# Patient Record
Sex: Male | Born: 1990 | Race: White | Hispanic: No | Marital: Single | Smoking: Former smoker
Health system: Southern US, Community
[De-identification: ages and names within clinical notes are randomized; demographics above are authoritative.]

## PROBLEM LIST (undated history)

## (undated) DIAGNOSIS — F909 Attention-deficit hyperactivity disorder, unspecified type: Secondary | ICD-10-CM

## (undated) DIAGNOSIS — F319 Bipolar disorder, unspecified: Secondary | ICD-10-CM

## (undated) DIAGNOSIS — J45909 Unspecified asthma, uncomplicated: Secondary | ICD-10-CM

## (undated) DIAGNOSIS — F988 Other specified behavioral and emotional disorders with onset usually occurring in childhood and adolescence: Secondary | ICD-10-CM

## (undated) DIAGNOSIS — F449 Dissociative and conversion disorder, unspecified: Secondary | ICD-10-CM

## (undated) HISTORY — PX: MOLE REMOVAL: SHX2046

---
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
## 2013-12-13 ENCOUNTER — Emergency Department (HOSPITAL_COMMUNITY)
Admission: EM | Admit: 2013-12-13 | Discharge: 2013-12-13 | Disposition: A | Discharge status: Home / Self Care | Payer: Self-pay | Attending: Emergency Medicine | Admitting: Emergency Medicine

## 2013-12-13 ENCOUNTER — Encounter (HOSPITAL_COMMUNITY): Payer: Self-pay | Admitting: Emergency Medicine

## 2013-12-13 DIAGNOSIS — M25539 Pain in unspecified wrist: Secondary | ICD-10-CM | POA: Insufficient documentation

## 2013-12-13 DIAGNOSIS — M79639 Pain in unspecified forearm: Secondary | ICD-10-CM

## 2013-12-13 DIAGNOSIS — F17200 Nicotine dependence, unspecified, uncomplicated: Secondary | ICD-10-CM | POA: Insufficient documentation

## 2013-12-13 DIAGNOSIS — J45909 Unspecified asthma, uncomplicated: Secondary | ICD-10-CM | POA: Insufficient documentation

## 2013-12-13 DIAGNOSIS — Z8659 Personal history of other mental and behavioral disorders: Secondary | ICD-10-CM | POA: Insufficient documentation

## 2013-12-13 HISTORY — DX: Attention-deficit hyperactivity disorder, unspecified type: F90.9

## 2013-12-13 HISTORY — DX: Unspecified asthma, uncomplicated: J45.909

## 2013-12-13 HISTORY — DX: Dissociative and conversion disorder, unspecified: F44.9

## 2013-12-13 HISTORY — DX: Bipolar disorder, unspecified: F31.9

## 2013-12-13 HISTORY — DX: Other specified behavioral and emotional disorders with onset usually occurring in childhood and adolescence: F98.8

## 2013-12-13 MED ORDER — METAXALONE 800 MG PO TABS
800.0000 mg | ORAL_TABLET | Freq: Three times a day (TID) | ORAL | Status: DC
Start: 2013-12-13 — End: 2014-08-19

## 2013-12-13 MED ORDER — IBUPROFEN 600 MG PO TABS: 600.0000 mg | ORAL_TABLET | Freq: Four times a day (QID) | ORAL | Status: DC | PRN

## 2013-12-13 NOTE — ED Notes (Addendum)
Pt c/o pain from forearm to elbow and down to wrist x 3-4 days, pt denies injury. Pt states he is having difficulty closing fist. States his arms feels tired. Strong pulse, brisk cap refill

## 2013-12-13 NOTE — ED Provider Notes (Signed)
CSN: [id number]     Arrival date & time 12/13/13  [id number] History   First MD Initiated Contact with Patient 12/13/13 0715     Chief Complaint  Patient presents with  . Arm Pain     (Consider location/radiation/quality/duration/timing/severity/associated sxs/prior Treatment) Patient is a 23 y.o. male presenting with arm pain. The history is provided by the patient.  Arm Pain   patient here complaining of pain to his left forearm x3-4 days. Pain characterized as sharp worse with movement of his wrist. Denies any history of trauma. Does work in [employer] and does heavy lifting. Pain starts at his elbow and goes onto his wrist. No fever or chills. No erythema noted. No prior history of same. Symptoms persisted and no treatment used prior to arrival  Past Medical History  Diagnosis Date  . Asthma   . ADD (attention deficit disorder)   . ADHD (attention deficit hyperactivity disorder)   . Disassociation disorder   . Manic depression    Past Surgical History  Procedure Laterality Date  . Mole removal     No family history on file. History  Substance Use Topics  . Smoking status: Current Every Day Smoker  . Smokeless tobacco: Not on file  . Alcohol Use: Yes    Review of Systems  All other systems reviewed and are negative.     Allergies  Review of patient's allergies indicates no known allergies.  Home Medications   Prior to Admission medications   Medication Sig Start Date End Date Taking? Authorizing Provider  ibuprofen (ADVIL,MOTRIN) 600 MG tablet Take 1 tablet (600 mg total) by mouth every 6 (six) hours as needed. 12/13/13   Toy BakerAnthony T Mataio Mele, MD  metaxalone (SKELAXIN) 800 MG tablet Take 1 tablet (800 mg total) by mouth 3 (three) times daily. 12/13/13   Toy BakerAnthony T Velia Pamer, MD   BP 131/85  Pulse 83  Temp(Src) 98.3 F (36.8 C) (Oral)  Resp 16  Ht 5\' 7"  (1.702 m)  Wt 150 lb (68.04 kg)  BMI 23.49 kg/m2  SpO2 99% Physical Exam  Nursing note and vitals  reviewed. Constitutional: He is oriented to person, place, and time. He appears well-developed and well-nourished.  Non-toxic appearance. No distress.  HENT:  Head: Normocephalic and atraumatic.  Eyes: Conjunctivae, EOM and lids are normal. Pupils are equal, round, and reactive to light.  Neck: Normal range of motion. Neck supple. No tracheal deviation present. No mass present.  Cardiovascular: Normal rate, regular rhythm and normal heart sounds.  Exam reveals no gallop.   No murmur heard. Pulmonary/Chest: Effort normal and breath sounds normal. No stridor. No respiratory distress. He has no decreased breath sounds. He has no wheezes. He has no rhonchi. He has no rales.  Abdominal: Soft. Normal appearance and bowel sounds are normal. He exhibits no distension. There is no tenderness. There is no rebound and no CVA tenderness.  Musculoskeletal: Normal range of motion. He exhibits no edema and no tenderness.       Arms: Neurological: He is alert and oriented to person, place, and time. He has normal strength. No cranial nerve deficit or sensory deficit. GCS eye subscore is 4. GCS verbal subscore is 5. GCS motor subscore is 6.  Skin: Skin is warm and dry. No abrasion and no rash noted.  Psychiatric: He has a normal mood and affect. His speech is normal and behavior is normal.    ED Course  Procedures (including critical care time) Labs Review Labs Reviewed - No data  to display  Imaging Review No results found.   EKG Interpretation None      MDM   Final diagnoses:  Forearm pain   Patient without evidence of infection at this time. Likely has tendinitis. Return precautions given     Toy BakerAnthony T Leathie Weich, MD 12/13/13 [phone]

## 2013-12-13 NOTE — Discharge Instructions (Signed)
Return here for swelling in your forearm, trouble using the fingers in the left hand, severe pain, or any other problems

## 2013-12-21 ENCOUNTER — Emergency Department (HOSPITAL_COMMUNITY)
Admission: EM | Admit: 2013-12-21 | Discharge: 2013-12-21 | Disposition: A | Discharge status: Home / Self Care | Payer: Self-pay | Attending: Emergency Medicine | Admitting: Emergency Medicine

## 2013-12-21 ENCOUNTER — Encounter (HOSPITAL_COMMUNITY): Payer: Self-pay | Admitting: Emergency Medicine

## 2013-12-21 DIAGNOSIS — F1010 Alcohol abuse, uncomplicated: Secondary | ICD-10-CM

## 2013-12-21 DIAGNOSIS — F1994 Other psychoactive substance use, unspecified with psychoactive substance-induced mood disorder: Secondary | ICD-10-CM

## 2013-12-21 DIAGNOSIS — F17200 Nicotine dependence, unspecified, uncomplicated: Secondary | ICD-10-CM | POA: Insufficient documentation

## 2013-12-21 DIAGNOSIS — R45851 Suicidal ideations: Secondary | ICD-10-CM

## 2013-12-21 DIAGNOSIS — F1094 Alcohol use, unspecified with alcohol-induced mood disorder: Secondary | ICD-10-CM | POA: Diagnosis present

## 2013-12-21 DIAGNOSIS — IMO0002 Reserved for concepts with insufficient information to code with codable children: Secondary | ICD-10-CM | POA: Insufficient documentation

## 2013-12-21 DIAGNOSIS — F29 Unspecified psychosis not due to a substance or known physiological condition: Secondary | ICD-10-CM | POA: Insufficient documentation

## 2013-12-21 DIAGNOSIS — F1210 Cannabis abuse, uncomplicated: Secondary | ICD-10-CM

## 2013-12-21 DIAGNOSIS — F10920 Alcohol use, unspecified with intoxication, uncomplicated: Secondary | ICD-10-CM

## 2013-12-21 DIAGNOSIS — J45909 Unspecified asthma, uncomplicated: Secondary | ICD-10-CM | POA: Insufficient documentation

## 2013-12-21 DIAGNOSIS — Z79899 Other long term (current) drug therapy: Secondary | ICD-10-CM | POA: Insufficient documentation

## 2013-12-21 LAB — CBC WITH DIFFERENTIAL/PLATELET
Basophils Absolute: 0 10*3/uL (ref 0.0–0.1)
Basophils Relative: 0 % (ref 0–1)
Eosinophils Absolute: 0.1 10*3/uL (ref 0.0–0.7)
Eosinophils Relative: 2 % (ref 0–5)
HCT: 40.7 % (ref 39.0–52.0)
Hemoglobin: 14.6 g/dL (ref 13.0–17.0)
Lymphocytes Relative: 43 % (ref 12–46)
Lymphs Abs: 3 10*3/uL (ref 0.7–4.0)
MCH: 31.4 pg (ref 26.0–34.0)
MCHC: 35.9 g/dL (ref 30.0–36.0)
MCV: 87.5 fL (ref 78.0–100.0)
Monocytes Absolute: 0.4 10*3/uL (ref 0.1–1.0)
Monocytes Relative: 5 % (ref 3–12)
Neutro Abs: 3.3 10*3/uL (ref 1.7–7.7)
Neutrophils Relative %: 50 % (ref 43–77)
Platelets: 216 10*3/uL (ref 150–400)
RBC: 4.65 MIL/uL (ref 4.22–5.81)
RDW: 13.2 % (ref 11.5–15.5)
WBC: 6.8 10*3/uL (ref 4.0–10.5)

## 2013-12-21 LAB — BASIC METABOLIC PANEL
BUN: 13 mg/dL (ref 6–23)
CO2: 25 mEq/L (ref 19–32)
Calcium: 9.4 mg/dL (ref 8.4–10.5)
Chloride: 103 mEq/L (ref 96–112)
Creatinine, Ser: 1.03 mg/dL (ref 0.50–1.35)
GFR calc Af Amer: >90 mL/min (ref >90)
GFR calc non Af Amer: >90 mL/min (ref >90)
Glucose, Bld: 99 mg/dL (ref 70–99)
Potassium: 3.7 mEq/L (ref 3.7–5.3)
Sodium: 143 mEq/L (ref 137–147)

## 2013-12-21 LAB — RAPID URINE DRUG SCREEN, HOSP PERFORMED
Amphetamines: NOT DETECTED
Barbiturates: NOT DETECTED
Benzodiazepines: NOT DETECTED
Cocaine: NOT DETECTED
Opiates: NOT DETECTED
Tetrahydrocannabinol: POSITIVE — AB

## 2013-12-21 LAB — ETHANOL: Alcohol, Ethyl (B): 137 mg/dL — ABNORMAL HIGH (ref 0–11)

## 2013-12-21 MED ORDER — ZIPRASIDONE MESYLATE 20 MG IM SOLR
10.0000 mg | Freq: Once | INTRAMUSCULAR | Status: AC
  Administered 2013-12-21: 10 mg via INTRAMUSCULAR
  Filled 2013-12-21: qty 20

## 2013-12-21 MED ORDER — ACETAMINOPHEN 325 MG PO TABS: 650.0000 mg | ORAL_TABLET | ORAL | Status: DC | PRN

## 2013-12-21 MED ORDER — ONDANSETRON HCL 4 MG PO TABS: 4.0000 mg | ORAL_TABLET | Freq: Three times a day (TID) | ORAL | Status: DC | PRN

## 2013-12-21 MED ORDER — IBUPROFEN 200 MG PO TABS: 600.0000 mg | ORAL_TABLET | Freq: Three times a day (TID) | ORAL | Status: DC | PRN

## 2013-12-21 MED ORDER — STERILE WATER FOR INJECTION IJ SOLN
INTRAMUSCULAR | Status: AC
  Administered 2013-12-21: 1.2 mL
  Filled 2013-12-21: qty 10

## 2013-12-21 MED ORDER — ZOLPIDEM TARTRATE 5 MG PO TABS: 5.0000 mg | ORAL_TABLET | Freq: Every evening | ORAL | Status: DC | PRN

## 2013-12-21 MED ORDER — NICOTINE 21 MG/24HR TD PT24: 21.0000 mg | MEDICATED_PATCH | Freq: Every day | TRANSDERMAL | Status: DC

## 2013-12-21 MED ORDER — ALUM & MAG HYDROXIDE-SIMETH 200-200-20 MG/5ML PO SUSP: 30.0000 mL | ORAL | Status: DC | PRN

## 2013-12-21 NOTE — ED Notes (Signed)
Pt arrived to the ED with a complaint of suicidal intent.  Pt's girlfriend called [employer] stating that he was manic and wanted to hang himself from a bridge.  Pt states he has been suicidal for 6 years.  Pt states he will behave until he gets back to the psych ED then he will strip the sheets and hang himself

## 2013-12-21 NOTE — ED Notes (Signed)
Attempted to review D/C instructions but he stated "Ive been in and out of the system so much I could probably read them to you." No complaints voiced. States pain is a 1 due to "a scab on my foot." belongings bag x1 returned. Girlfriend here to transport home. Escorted by mental health tech to front of the hospital.

## 2013-12-21 NOTE — Discharge Instructions (Signed)
Alcohol Intoxication Alcohol intoxication occurs when you drink enough alcohol that it affects your ability to function. It can be mild or very severe. Drinking a lot of alcohol in a short time is called binge drinking. This can be very harmful. Drinking alcohol can also be more dangerous if you are taking medicines or other drugs. Some of the effects caused by alcohol may include:  Loss of coordination.  Changes in mood and behavior.  Unclear thinking.  Trouble talking (slurred speech).  Throwing up (vomiting).  Confusion.  Slowed breathing.  Twitching and shaking (seizures).  Loss of consciousness. HOME CARE  Do not drive after drinking alcohol.  Drink enough water and fluids to keep your pee (urine) clear or pale yellow. Avoid caffeine.  Only take medicine as told by your doctor. GET HELP IF:  You throw up (vomit) many times.  You do not feel better after a few days.  You frequently have alcohol intoxication. Your doctor can help decide if you should see a substance use treatment counselor. GET HELP RIGHT AWAY IF:  You become shaky when you stop drinking.  You have twitching and shaking.  You throw up blood. It may look bright red or like coffee grounds.  You notice blood in your poop (bowel movements).  You become lightheaded or pass out (faint). MAKE SURE YOU:   Understand these instructions.  Will watch your condition.  Will get help right away if you are not doing well or get worse. Document Released: 12/06/2007 Document Revised: 02/19/2013 Document Reviewed: 11/22/2012 [facility] Patient Information 2015 [place], [place]. This information is not intended to replace advice given to you by your health care provider. Make sure you discuss any questions you have with your health care provider.  Alcohol Use Disorder Alcohol use disorder is a mental disorder. It is not a one-time incident of heavy drinking. Alcohol use disorder is the excessive and  uncontrollable use of alcohol over time that leads to problems with functioning in one or more areas of daily living. People with this disorder risk harming themselves and others when they drink to excess. Alcohol use disorder also can cause other mental disorders, such as mood and anxiety disorders, and serious physical problems. People with alcohol use disorder often misuse other drugs.  Alcohol use disorder is common and widespread. Some people with this disorder drink alcohol to cope with or escape from negative life events. Others drink to relieve chronic pain or symptoms of mental illness. People with a family history of alcohol use disorder are at higher risk of losing control and using alcohol to excess.  SYMPTOMS  Signs and symptoms of alcohol use disorder may include the following:   Consumption ofalcohol inlarger amounts or over a longer period of time than intended.  Multiple unsuccessful attempts to cutdown or control alcohol use.   A great deal of time spent obtaining alcohol, using alcohol, or recovering from the effects of alcohol (hangover).  A strong desire or urge to use alcohol (cravings).   Continued use of alcohol despite problems at work, school, or home because of alcohol use.   Continued use of alcohol despite problems in relationships because of alcohol use.  Continued use of alcohol in situations when it is physically hazardous, such as driving a car.  Continued use of alcohol despite awareness of a physical or psychological problem that is likely related to alcohol use. Physical problems related to alcohol use can involve the brain, heart, liver, stomach, and intestines. Psychological problems related  to alcohol use include intoxication, depression, anxiety, psychosis, delirium, and dementia.   The need for increased amounts of alcohol to achieve the same desired effect, or a decreased effect from the consumption of the same amount of alcohol  (tolerance).  Withdrawal symptoms upon reducing or stopping alcohol use, or alcohol use to reduce or avoid withdrawal symptoms. Withdrawal symptoms include:  Racing heart.  Hand tremor.  Difficulty sleeping.  Nausea.  Vomiting.  Hallucinations.  Restlessness.  Seizures. DIAGNOSIS Alcohol use disorder is diagnosed through an assessment by your caregiver. Your caregiver may start by asking three or four questions to screen for excessive or problematic alcohol use. To confirm a diagnosis of alcohol use disorder, at least two symptoms (see SYMPTOMS) must be present within a 6865-month period. The severity of alcohol use disorder depends on the number of symptoms:  Mild--two or three.  Moderate--four or five.  Severe--six or more. Your caregiver may perform a physical exam or use results from lab tests to see if you have physical problems resulting from alcohol use. Your caregiver may refer you to a mental health professional for evaluation. TREATMENT  Some people with alcohol use disorder are able to reduce their alcohol use to low-risk levels. Some people with alcohol use disorder need to quit drinking alcohol. When necessary, mental health professionals with specialized training in substance use treatment can help. Your caregiver can help you decide how severe your alcohol use disorder is and what type of treatment you need. The following forms of treatment are available:   Detoxification. Detoxification involves the use of prescription medication to prevent alcohol withdrawal symptoms in the first week after quitting. This is important for people with a history of symptoms of withdrawal and for heavy drinkers who are likely to have withdrawal symptoms. Alcohol withdrawal can be dangerous and, in severe cases, cause death. Detoxification is usually provided in a hospital or in-patient substance use treatment facility.  Counseling or talk therapy. Talk therapy is provided by substance use  treatment counselors. It addresses the reasons people use alcohol and ways to keep them from drinking again. The goals of talk therapy are to help people with alcohol use disorder find healthy activities and ways to cope with life stress, to identify and avoid triggers for alcohol use, and to handle cravings, which can cause relapse.  Medication.Different medications can help treat alcohol use disorder through the following actions:  Decrease alcohol cravings.  Decrease the positive reward response felt from alcohol use.  Produce an uncomfortable physical reaction when alcohol is used (aversion therapy).  Support groups. Support groups are run by people who have quit drinking. They provide emotional support, advice, and guidance. These forms of treatment are often combined. Some people with alcohol use disorder benefit from intensive combination treatment provided by specialized substance use treatment centers. Both inpatient and outpatient treatment programs are available. Document Released: 07/27/2004 Document Revised: 02/19/2013 Document Reviewed: 09/26/2012 [facility] Patient Information 2015 [place], [place]. This information is not intended to replace advice given to you by your health care provider. Make sure you discuss any questions you have with your health care provider.  Alcohol Problems Most adults who drink alcohol drink in moderation (not a lot) are at low risk for developing problems related to their drinking. However, all drinkers, including low-risk drinkers, should know about the health risks connected with drinking alcohol. RECOMMENDATIONS FOR LOW-RISK DRINKING  Drink in moderation. Moderate drinking is defined as follows:   Men - no more than 2 drinks per  day.  Nonpregnant women - no more than 1 drink per day.  Over age 84 - no more than 1 drink per day. A standard drink is 12 grams of pure alcohol, which is equal to a 12 ounce bottle of beer or wine cooler, a 5 ounce  glass of wine, or 1.5 ounces of distilled spirits (such as whiskey, brandy, vodka, or rum).  ABSTAIN FROM (DO NOT DRINK) ALCOHOL:  When pregnant or considering pregnancy.  When taking a medication that interacts with alcohol.  If you are alcohol dependent.  A medical condition that prohibits drinking alcohol (such as ulcer, liver disease, or heart disease). DISCUSS WITH YOUR CAREGIVER:  If you are at risk for coronary heart disease, discuss the potential benefits and risks of alcohol use: Light to moderate drinking is associated with lower rates of coronary heart disease in certain populations (for example, men over age 41 and postmenopausal women). Infrequent or nondrinkers are advised not to begin light to moderate drinking to reduce the risk of coronary heart disease so as to avoid creating an alcohol-related problem. Similar protective effects can likely be gained through proper diet and exercise.  Women and the elderly have smaller amounts of body water than men. As a result women and the elderly achieve a higher blood alcohol concentration after drinking the same amount of alcohol.  Exposing a fetus to alcohol can cause a broad range of birth defects referred to as Fetal Alcohol Syndrome (FAS) or Alcohol-Related Birth Defects (ARBD). Although FAS/ARBD is connected with excessive alcohol consumption during pregnancy, studies also have reported neurobehavioral problems in infants born to mothers reporting drinking an average of 1 drink per day during pregnancy.  Heavier drinking (the consumption of more than 4 drinks per occasion by men and more than 3 drinks per occasion by women) impairs learning (cognitive) and psychomotor functions and increases the risk of alcohol-related problems, including accidents and injuries. CAGE QUESTIONS:   Have you ever felt that you should Cut down on your drinking?  Have people Annoyed you by criticizing your drinking?  Have you ever felt bad or Guilty  about your drinking?  Have you ever had a drink first thing in the morning to steady your nerves or get rid of a hangover (Eye opener)? If you answered positively to any of these questions: You may be at risk for alcohol-related problems if alcohol consumption is:   Men: Greater than 14 drinks per week or more than 4 drinks per occasion.  Women: Greater than 7 drinks per week or more than 3 drinks per occasion. Do you or your family have a medical history of alcohol-related problems, such as:  Blackouts.  Sexual dysfunction.  Depression.  Trauma.  Liver dysfunction.  Sleep disorders.  Hypertension.  Chronic abdominal pain.  Has your drinking ever caused you problems, such as problems with your family, problems with your work (or school) performance, or accidents/injuries?  Do you have a compulsion to drink or a preoccupation with drinking?  Do you have poor control or are you unable to stop drinking once you have started?  Do you have to drink to avoid withdrawal symptoms?  Do you have problems with withdrawal such as tremors, nausea, sweats, or mood disturbances?  Does it take more alcohol than in the past to get you high?  Do you feel a strong urge to drink?  Do you change your plans so that you can have a drink?  Do you ever drink in the morning to  relieve the shakes or a hangover? If you have answered a number of the previous questions positively, it may be time for you to talk to your caregivers, family, and friends and see if they think you have a problem. Alcoholism is a chemical dependency that keeps getting worse and will eventually destroy your health and relationships. Many alcoholics end up dead, impoverished, or in prison. This is often the end result of all chemical dependency.  Do not be discouraged if you are not ready to take action immediately.  Decisions to change behavior often involve up and down desires to change and feeling like you cannot  decide.  Try to think more seriously about your drinking behavior.  Think of the reasons to quit. WHERE TO GO FOR ADDITIONAL INFORMATION   The National Institute on Alcohol Abuse and Alcoholism (NIAAA) BasicStudents.dkwww.niaaa.nih.gov  [employer] on Alcoholism and Drug Dependence (NCADD) www.ncadd.org  American Society of Addiction Medicine (ASAM) RoyalDiary.glwww.asam.org  Document Released: 06/19/2005 Document Revised: 09/11/2011 Document Reviewed: 02/05/2008 [facility] Patient Information 2015 [place] [employer]. This information is not intended to replace advice given to you by your health care provider. Make sure you discuss any questions you have with your health care provider.  Alcohol Intoxication Alcohol intoxication occurs when the amount of alcohol that a person has consumed impairs his or her ability to mentally and physically function. Alcohol directly impairs the normal chemical activity of the brain. Drinking large amounts of alcohol can lead to changes in mental function and behavior, and it can cause many physical effects that can be harmful.  Alcohol intoxication can range in severity from mild to very severe. Various factors can affect the level of intoxication that occurs, such as the person's age, gender, weight, frequency of alcohol consumption, and the presence of other medical conditions (such as diabetes, seizures, or heart conditions). Dangerous levels of alcohol intoxication may occur when people drink large amounts of alcohol in a short period (binge drinking). Alcohol can also be especially dangerous when combined with certain prescription medicines or "recreational" drugs. SIGNS AND SYMPTOMS Some common signs and symptoms of mild alcohol intoxication include:  Loss of coordination.  Changes in mood and behavior.  Impaired judgment.  Slurred speech. As alcohol intoxication progresses to more severe levels, other signs and symptoms will appear. These may  include:  Vomiting.  Confusion and impaired memory.  Slowed breathing.  Seizures.  Loss of consciousness. DIAGNOSIS  Your health care provider will take a medical history and perform a physical exam. You will be asked about the amount and type of alcohol you have consumed. Blood tests will be done to measure the concentration of alcohol in your blood. In many places, your blood alcohol level must be lower than 80 mg/dL (1.61%0.08%) to legally drive. However, many dangerous effects of alcohol can occur at much lower levels.  TREATMENT  People with alcohol intoxication often do not require treatment. Most of the effects of alcohol intoxication are temporary, and they go away as the alcohol naturally leaves the body. Your health care provider will monitor your condition until you are stable enough to go home. Fluids are sometimes given through an IV access tube to help prevent dehydration.  HOME CARE INSTRUCTIONS  Do not drive after drinking alcohol.  Stay hydrated. Drink enough water and fluids to keep your urine clear or pale yellow. Avoid caffeine.   Only take over-the-counter or prescription medicines as directed by your health care provider.  SEEK MEDICAL CARE IF:   You  have persistent vomiting.   You do not feel better after a few days.  You have frequent alcohol intoxication. Your health care provider can help determine if you should see a substance use treatment counselor. SEEK IMMEDIATE MEDICAL CARE IF:   You become shaky or tremble when you try to stop drinking.   You shake uncontrollably (seizure).   You throw up (vomit) blood. This may be bright red or may look like black coffee grounds.   You have blood in your stool. This may be bright red or may appear as a black, tarry, bad smelling stool.   You become lightheaded or faint.  MAKE SURE YOU:   Understand these instructions.  Will watch your condition.  Will get help right away if you are not doing well or  get worse. Document Released: 03/29/2005 Document Revised: 02/19/2013 Document Reviewed: 11/22/2012 [facility] Patient Information 2015 [place], [place]. This information is not intended to replace advice given to you by your health care provider. Make sure you discuss any questions you have with your health care provider.

## 2013-12-21 NOTE — ED Notes (Signed)
States he has an "alter" named Vincenza HewsShane.

## 2013-12-21 NOTE — ED Notes (Signed)
Pt's ex girlfriend came to visit patient and was made aware of the visiting hours. Pt left her contact info- Melissa Livingston [phone]. Pt is very adamant that he wants her to visit him tomorrow.

## 2013-12-21 NOTE — Consult Note (Signed)
[facility] Face-to-Face Psychiatry Consult   Reason for Consult:  Alcohol intoxication  Referring Physician:  EDP  Leslie Fowler is an 23 y.o. male. Total Time spent with patient: 45 minutes  Assessment: AXIS I:  Alcohol Abuse and Substance Induced Mood Disorder AXIS II:  Deferred AXIS III:   Past Medical History  Diagnosis Date  . Asthma   . ADD (attention deficit disorder)   . ADHD (attention deficit hyperactivity disorder)   . Disassociation disorder   . Manic depression    AXIS IV:  other psychosocial or environmental problems AXIS V:  61-70 mild symptoms  Plan:  No evidence of imminent risk to self or others at present.   Patient does not meet criteria for psychiatric inpatient admission. Supportive therapy provided about ongoing stressors. Discussed crisis plan, support from social network, calling 911, coming to the Emergency Department, and calling Suicide Hotline.  Subjective:   Leslie Fowler is a 23 y.o. male patient.  HPI:  Patient states "I'm not to shabby.  I had a little to much to drink and everything exploded.  I drunk 2 twenty-four oz beers.  I don't want no medications, I don't want to go to no hospital.  I just had to much to drink and said some stupid stuff.  I don't even drink every day.  I might have something to drink once of twice a month.  I don't want to kill myself; and I don't need no outpatient." Patient denies suicidal/homicidal ideation, psychosis, and paranoia  HPI Elements:   Location:  alcohol intoxication. Quality:  alcohol induced mood disorder. Severity:  suicidal ideation. Timing:  1 day. History reviewed. No pertinent family history. Review of Systems  Constitutional: Negative for diaphoresis.  Gastrointestinal: Negative for nausea, vomiting, abdominal pain, diarrhea and constipation.  Musculoskeletal: Negative.   Neurological: Negative for tremors, seizures, weakness and headaches.  Psychiatric/Behavioral: Negative for  depression, suicidal ideas, hallucinations, memory loss and substance abuse. The patient is not nervous/anxious and does not have insomnia.     Past Psychiatric History: Past Medical History  Diagnosis Date  . Asthma   . ADD (attention deficit disorder)   . ADHD (attention deficit hyperactivity disorder)   . Disassociation disorder   . Manic depression     reports that he has been smoking.  He does not have any smokeless tobacco history on file. He reports that he drinks alcohol. He reports that he uses illicit drugs (Marijuana). History reviewed. No pertinent family history. Family History Substance Abuse: No Family Supports: No Living Arrangements: Other (Comment) (Pt reports that he is homeless) Can pt return to current living arrangement?: Yes Abuse/Neglect [facility]) Physical Abuse: Denies Verbal Abuse: Denies Sexual Abuse: Denies Allergies:  No Known Allergies  ACT Assessment Complete:  Yes:    Educational Status    Risk to Self: Risk to self Suicidal Ideation: No Suicidal Intent: No Is patient at risk for suicide?: Yes Suicidal Plan?: No-Not Currently/Within Last 6 Months (Had said he would hang self.) Access to Means: Yes Specify Access to Suicidal Means: Could get rope What has been your use of drugs/alcohol within the last 12 months?: THC use.  + for ETOH Previous Attempts/Gestures: Yes How many times?: 2 Other Self Harm Risks: N/A Triggers for Past Attempts: None known Intentional Self Injurious Behavior: Cutting (Reports last cutting was 6 months ago) Comment - Self Injurious Behavior: Cutting. Family Suicide History: No Recent stressful life event(s): Conflict (Comment) (Girlfriend and he argue) Persecutory voices/beliefs?: No Depression: Yes  Depression Symptoms: Despondent;Insomnia;Isolating;Guilt;Loss of interest in usual pleasures Substance abuse history and/or treatment for substance abuse?: Yes (UDS positive for THC     BAL 137) Suicide prevention  information given to non-admitted patients: Not applicable  Risk to Others: Risk to Others Homicidal Ideation: No Thoughts of Harm to Others: No Current Homicidal Intent: No Current Homicidal Plan: No Access to Homicidal Means: No Identified Victim: No one History of harm to others?: No Assessment of Violence: On admission Violent Behavior Description: Pt had to be restrained in ED Does patient have access to weapons?: No Criminal Charges Pending?: No Does patient have a court date: No  Abuse: Abuse/Neglect Assessment (Assessment to be complete while patient is alone) Physical Abuse: Denies Verbal Abuse: Denies Sexual Abuse: Denies Exploitation of patient/patient's resources: Denies Self-Neglect: Denies  Prior Inpatient Therapy: Prior Inpatient Therapy Prior Inpatient Therapy: Yes Prior Therapy Dates: Oct '14 Prior Therapy Facilty/Wladyslaw Henrichs(s): facility at [facility]. Reason for Treatment: Depression, sI  Prior Outpatient Therapy: Prior Outpatient Therapy Prior Outpatient Therapy: No Prior Therapy Dates: None currrently Prior Therapy Facilty/Cynara Tatham(s): None currently  Reason for Treatment: N/A  Additional Information: Additional Information 1:1 In Past 12 Months?: No CIRT Risk: No Elopement Risk: No Does patient have medical clearance?: Yes    Objective: Blood pressure 135/75, pulse 77, temperature 98.5 F (36.9 C), temperature source Oral, resp. rate 18, SpO2 97.00%.There is no weight on file to calculate BMI. Results for orders placed during the hospital encounter of 12/21/13 (from the past 72 hour(s))  CBC WITH DIFFERENTIAL     Status: None   Collection Time    12/21/13  1:22 AM      Result Value Ref Range   WBC 6.8  4.0 - 10.5 K/uL   RBC 4.65  4.22 - 5.81 MIL/uL   Hemoglobin 14.6  13.0 - 17.0 g/dL   HCT 40.7  39.0 - 52.0 %   MCV 87.5  78.0 - 100.0 fL   MCH 31.4  26.0 - 34.0 pg   MCHC 35.9  30.0 - 36.0 g/dL   RDW 13.2  11.5 - 15.5 %   Platelets 216  150 -  400 K/uL   Neutrophils Relative % 50  43 - 77 %   Neutro Abs 3.3  1.7 - 7.7 K/uL   Lymphocytes Relative 43  12 - 46 %   Lymphs Abs 3.0  0.7 - 4.0 K/uL   Monocytes Relative 5  3 - 12 %   Monocytes Absolute 0.4  0.1 - 1.0 K/uL   Eosinophils Relative 2  0 - 5 %   Eosinophils Absolute 0.1  0.0 - 0.7 K/uL   Basophils Relative 0  0 - 1 %   Basophils Absolute 0.0  0.0 - 0.1 K/uL  BASIC METABOLIC PANEL     Status: None   Collection Time    12/21/13  1:22 AM      Result Value Ref Range   Sodium 143  137 - 147 mEq/L   Potassium 3.7  3.7 - 5.3 mEq/L   Chloride 103  96 - 112 mEq/L   CO2 25  19 - 32 mEq/L   Glucose, Bld 99  70 - 99 mg/dL   BUN 13  6 - 23 mg/dL   Creatinine, Ser 1.03  0.50 - 1.35 mg/dL   Calcium 9.4  8.4 - 10.5 mg/dL   GFR calc non Af Amer >90  >90 mL/min   GFR calc Af Amer >90  >90 mL/min  Comment: (NOTE)     The eGFR has been calculated using the CKD EPI equation.     This calculation has not been validated in all clinical situations.     eGFR's persistently <90 mL/min signify possible Chronic Kidney     Disease.  ETHANOL     Status: Abnormal   Collection Time    12/21/13  1:22 AM      Result Value Ref Range   Alcohol, Ethyl (B) 137 (*) 0 - 11 mg/dL   Comment:            LOWEST DETECTABLE LIMIT FOR     SERUM ALCOHOL IS 11 mg/dL     FOR MEDICAL PURPOSES ONLY  URINE RAPID DRUG SCREEN (HOSP PERFORMED)     Status: Abnormal   Collection Time    12/21/13  1:36 AM      Result Value Ref Range   Opiates NONE DETECTED  NONE DETECTED   Cocaine NONE DETECTED  NONE DETECTED   Benzodiazepines NONE DETECTED  NONE DETECTED   Amphetamines NONE DETECTED  NONE DETECTED   Tetrahydrocannabinol POSITIVE (*) NONE DETECTED   Barbiturates NONE DETECTED  NONE DETECTED   Comment:            DRUG SCREEN FOR MEDICAL PURPOSES     ONLY.  IF CONFIRMATION IS NEEDED     FOR ANY PURPOSE, NOTIFY LAB     WITHIN 5 DAYS.                LOWEST DETECTABLE LIMITS     FOR URINE DRUG SCREEN      Drug Class       Cutoff (ng/mL)     Amphetamine      1000     Barbiturate      200     Benzodiazepine   528     Tricyclics       413     Opiates          300     Cocaine          300     THC              50   Labs are reviewed see above.  Medications reviewed and no changes made  Current Facility-Administered Medications  Medication Dose Route Frequency Anelly Samarin Last Rate Last Dose  . acetaminophen (TYLENOL) tablet 650 mg  650 mg Oral Q4H PRN Kristen N Ward, DO      . alum & mag hydroxide-simeth (MAALOX/MYLANTA) 200-200-20 MG/5ML suspension 30 mL  30 mL Oral PRN Kristen N Ward, DO      . ibuprofen (ADVIL,MOTRIN) tablet 600 mg  600 mg Oral Q8H PRN Kristen N Ward, DO      . nicotine (NICODERM CQ - dosed in mg/24 hours) patch 21 mg  21 mg Transdermal Daily Kristen N Ward, DO      . ondansetron (ZOFRAN) tablet 4 mg  4 mg Oral Q8H PRN Kristen N Ward, DO      . zolpidem (AMBIEN) tablet 5 mg  5 mg Oral QHS PRN Delice Bison Ward, DO       Current Outpatient Prescriptions  Medication Sig Dispense Refill  . ibuprofen (ADVIL,MOTRIN) 600 MG tablet Take 1 tablet (600 mg total) by mouth every 6 (six) hours as needed.  30 tablet  0  . metaxalone (SKELAXIN) 800 MG tablet Take 1 tablet (800 mg total) by mouth 3 (three) times daily.  21 tablet  0    Psychiatric Specialty Exam:     Blood pressure 135/75, pulse 77, temperature 98.5 F (36.9 C), temperature source Oral, resp. rate 18, SpO2 97.00%.There is no weight on file to calculate BMI.  General Appearance: Casual  Eye Contact::  Good  Speech:  Clear and Coherent  Volume:  Normal  Mood:  Anxious  Affect:  Congruent  Thought Process:  Circumstantial  Orientation:  Full (Time, Place, and Person)  Thought Content:  Rumination  Suicidal Thoughts:  No  Homicidal Thoughts:  No  Memory:  Immediate;   Good Recent;   Good Remote;   Good  Judgement:  Intact  Insight:  Fair and Present  Psychomotor Activity:  Normal  Concentration:  Fair  Recall:   Good  Fund of Knowledge:Good  Language: Good  Akathisia:  No  Handed:  Right  AIMS (if indicated):     Assets:  Communication Skills Desire for Improvement Housing Social Support  Sleep:      Musculoskeletal: Strength & Muscle Tone: within normal limits Gait & Station: normal Patient leans: N/A  Treatment Plan Summary: Discharge home with resources  Earleen Newport, FNP-BC 12/21/2013 1:07 PM  I have personally seen the patient and agreed with the findings and involved in the treatment plan. Berniece Andreas, MD

## 2013-12-21 NOTE — ED Provider Notes (Signed)
CSN: [id number]     Arrival date & time 12/21/13  0040 History   First MD Initiated Contact with Patient 12/21/13 0110     Chief Complaint  Patient presents with  . Suicidal      The history is provided by the police and the patient. The history is limited by the condition of the patient (Psychiatric disorder).  Pt was seen at 0115. Per Police and pt report: c/o gradual onset and worsening of persistent depression and SI for the past several weeks. Pt states "we haven't been able to smoke any weed to medicate and now look what's happened." Pt's significant other states pt told her he wanted to "hang himself from a bridge." Pt stated to ED staff on arrival that "when we get back to the psych ED we'll strip the sheets and hang myself." Pt often referring to himself in plural ("us" and "we") throughout HPI. Pt endorses long hx of mental health admissions "in a bunch of different states" for "mania," "SI" and "disassociation disorder."      Past Medical History  Diagnosis Date  . Asthma   . ADD (attention deficit disorder)   . ADHD (attention deficit hyperactivity disorder)   . Disassociation disorder   . Manic depression    Past Surgical History  Procedure Laterality Date  . Mole removal      History  Substance Use Topics  . Smoking status: Current Every Day Smoker  . Smokeless tobacco: Not on file  . Alcohol Use: Yes    Review of Systems  Unable to perform ROS: Psychiatric disorder      Allergies  Review of patient's allergies indicates no known allergies.  Home Medications   Prior to Admission medications   Medication Sig Start Date End Date Taking? Authorizing Provider  ibuprofen (ADVIL,MOTRIN) 600 MG tablet Take 1 tablet (600 mg total) by mouth every 6 (six) hours as needed. 12/13/13   Toy BakerAnthony T Allen, MD  metaxalone (SKELAXIN) 800 MG tablet Take 1 tablet (800 mg total) by mouth 3 (three) times daily. 12/13/13   Toy BakerAnthony T Allen, MD   BP 142/82  Pulse 94  Temp(Src)  98.6 F (37 C) (Oral)  Resp 22  SpO2 99% Physical Exam 0120: Physical examination:  Nursing notes reviewed; Vital signs and O2 SAT reviewed;  Constitutional: Well developed, Well nourished, Well hydrated, In no acute distress; Head:  Normocephalic, atraumatic; Eyes: EOMI, PERRL, No scleral icterus; ENMT: Mouth and pharynx normal, Mucous membranes moist; Neck: Supple, Full range of motion, No lymphadenopathy; Cardiovascular: Regular rate and rhythm, No gallop; Respiratory: Breath sounds clear & equal bilaterally, No wheezes.  Speaking full sentences with ease, Normal respiratory effort/excursion; Chest: Nontender, Movement normal; Abdomen: Soft, Nontender, Nondistended, Normal bowel sounds; Genitourinary: No CVA tenderness; Extremities: Pulses normal, No tenderness, No edema, No calf edema or asymmetry.; Neuro: AA&Ox3, Major CN grossly intact.  Speech clear. No gross focal motor or sensory deficits in extremities.; Skin: Color normal, Warm, Dry.; Psych:  Agitated.    ED Course  Procedures     MDM  MDM Reviewed: previous chart, nursing note and vitals Reviewed previous: labs Interpretation: labs   Results for orders placed during the hospital encounter of 12/21/13  CBC WITH DIFFERENTIAL      Result Value Ref Range   WBC 6.8  4.0 - 10.5 K/uL   RBC 4.65  4.22 - 5.81 MIL/uL   Hemoglobin 14.6  13.0 - 17.0 g/dL   HCT 78.240.7  95.639.0 - 21.352.0 %  MCV 87.5  78.0 - 100.0 fL   MCH 31.4  26.0 - 34.0 pg   MCHC 35.9  30.0 - 36.0 g/dL   RDW 16.113.2  09.611.5 - 04.515.5 %   Platelets 216  150 - 400 K/uL   Neutrophils Relative % 50  43 - 77 %   Neutro Abs 3.3  1.7 - 7.7 K/uL   Lymphocytes Relative 43  12 - 46 %   Lymphs Abs 3.0  0.7 - 4.0 K/uL   Monocytes Relative 5  3 - 12 %   Monocytes Absolute 0.4  0.1 - 1.0 K/uL   Eosinophils Relative 2  0 - 5 %   Eosinophils Absolute 0.1  0.0 - 0.7 K/uL   Basophils Relative 0  0 - 1 %   Basophils Absolute 0.0  0.0 - 0.1 K/uL  BASIC METABOLIC PANEL      Result Value Ref  Range   Sodium 143  137 - 147 mEq/L   Potassium 3.7  3.7 - 5.3 mEq/L   Chloride 103  96 - 112 mEq/L   CO2 25  19 - 32 mEq/L   Glucose, Bld 99  70 - 99 mg/dL   BUN 13  6 - 23 mg/dL   Creatinine, Ser 4.091.03  0.50 - 1.35 mg/dL   Calcium 9.4  8.4 - 81.110.5 mg/dL   GFR calc non Af Amer >90  >90 mL/min   GFR calc Af Amer >90  >90 mL/min  URINE RAPID DRUG SCREEN (HOSP PERFORMED)      Result Value Ref Range   Opiates NONE DETECTED  NONE DETECTED   Cocaine NONE DETECTED  NONE DETECTED   Benzodiazepines NONE DETECTED  NONE DETECTED   Amphetamines NONE DETECTED  NONE DETECTED   Tetrahydrocannabinol POSITIVE (*) NONE DETECTED   Barbiturates NONE DETECTED  NONE DETECTED  ETHANOL      Result Value Ref Range   Alcohol, Ethyl (B) 137 (*) 0 - 11 mg/dL    [id number]:  Pt agitated and yelling on arrival, Police have pt handcuffed to stretcher for pt and staff safety.  IM geodon given with good effect.  Pt calmer and more cooperative now. TTS has evaluated pt: IVC paperwork completed, pt will need further evaluation by Psychiatry team in the morning.      Laray AngerKathleen M McManus, DO 12/21/13 0740

## 2013-12-21 NOTE — [facility] Suicide Risk Assessment (Cosign Needed)
Suicide Risk Assessment  Discharge Assessment     Demographic Factors:  Male and Caucasian  Total Time spent with patient: 45 minutes Psychiatric Specialty Exam:      Blood pressure 135/75, pulse 77, temperature 98.5 F (36.9 C), temperature source Oral, resp. rate 18, SpO2 97.00%.There is no weight on file to calculate BMI.   General Appearance: Casual   Eye Contact:: Good   Speech: Clear and Coherent   Volume: Normal   Mood: Anxious   Affect: Congruent   Thought Process: Circumstantial   Orientation: Full (Time, Place, and Person)   Thought Content: Rumination   Suicidal Thoughts: No   Homicidal Thoughts: No   Memory: Immediate; Good  Recent; Good  Remote; Good   Judgement: Intact   Insight: Fair and Present   Psychomotor Activity: Normal   Concentration: Fair   Recall: Good   Fund of Knowledge:Good   Language: Good   Akathisia: No   Handed: Right   AIMS (if indicated):   Assets: Communication Skills  Desire for Improvement  Housing  Social Support   Sleep:   Musculoskeletal:  Strength & Muscle Tone: within normal limits  Gait & Station: normal  Patient leans: N/A   Mental Status Per Nursing Assessment::   On Admission:     Current Mental Status by Physician: Patient denies suicidal/homicidal ideation, psychosis, and paranoia  Loss Factors: NA  Historical Factors: NA  Risk Reduction Factors:   Sense of responsibility to family and Positive social support  Continued Clinical Symptoms:  Alcohol/Substance Abuse/Dependencies  Cognitive Features That Contribute To Risk:  Closed-mindedness    Suicide Risk:  Minimal: No identifiable suicidal ideation.  Patients presenting with no risk factors but with morbid ruminations; may be classified as minimal risk based on the severity of the depressive symptoms  Discharge Diagnoses: AXIS I: Alcohol Abuse and Substance Induced Mood Disorder  AXIS II: Deferred  AXIS III:  Past Medical History   Diagnosis   Date   .  Asthma    .  ADD (attention deficit disorder)    .  ADHD (attention deficit hyperactivity disorder)    .  Disassociation disorder    .  Manic depression     AXIS IV: other psychosocial or environmental problems  AXIS V: 61-70 mild symptoms  Plan Of Care/Follow-up recommendations:  Activity:  Resume usual activity Diet:  Resume usual diet  Is patient on multiple antipsychotic therapies at discharge:  No   Has Patient had three or more failed trials of antipsychotic monotherapy by history:  No  Recommended Plan for Multiple Antipsychotic Therapies: NA    Rankin, Shuvon, FNP-BC 12/21/2013, 1:20 PM

## 2013-12-21 NOTE — ED Notes (Signed)
Pt. and belongings wanded by security 

## 2013-12-21 NOTE — BH Assessment (Signed)
Assessment Note  Leslie Fowler is an 23 y.o. male.  -Clinician talked to Dr. Clarene Duke regarding reason for TTS request.  She said that patient appears to be very savvy about the ways to get into psychiatric hospital.  Patient has said that he wants to hang himself.  He told a nurse that he would hang himself with sheets while in the psychiatric ED.  Dr. Clarene Duke said that pt talks about the different psychiatric hospitals that he has been at and how he wants to be able to smoke marijuana.  Patient was given geodon earlier because he became loud and was threatening physical aggression.  Patient told Dr. Clarene Duke that he has other personalities that are aggressive.  Patient was brought from the main ED into the psych ED and clinician talked to patient about what happened.  Patient did not talk about any other personalities but said "I got out of control."  He denies any SI.  He said that today he was with gf and they were talking about the status of their relationship.  He claims that she did not want him to leave her home because she was afraid that he would leave her (relationship).  He told her that he would go out the 2nd floor window if she did not let him leave.  She kept him there until the police arrived to bring him to [facility].  Patient denies wanting to kill himself.  He admits to two previous attempts and he also says that he was at a psychiatric facility in [facility] in October.  Patient denies HI or A/V hallucinations.  Patient says he smokes 3 gm of marijuana a week with two weeks ago being last time.  He denies regular ETOH use although he is positive for same.  Patient care discussed with Maryjean Morn, PA and Dr. Clarene Duke.  Patient is on IVC and Dr. Clarene Duke completed 1st opinion.  Patient to be seen by psychiatry in AM to uphold or rescind IVC.  Axis I: Bipolar, mixed and Substance Abuse Axis II: Deferred Axis III:  Past Medical History  Diagnosis Date  . Asthma   . ADD  (attention deficit disorder)   . ADHD (attention deficit hyperactivity disorder)   . Disassociation disorder   . Manic depression    Axis IV: housing problems, occupational problems, other psychosocial or environmental problems, problems with access to health care services and problems with primary support group Axis V: 31-40 impairment in reality testing  Past Medical History:  Past Medical History  Diagnosis Date  . Asthma   . ADD (attention deficit disorder)   . ADHD (attention deficit hyperactivity disorder)   . Disassociation disorder   . Manic depression     Past Surgical History  Procedure Laterality Date  . Mole removal      Family History: History reviewed. No pertinent family history.  Social History:  reports that he has been smoking.  He does not have any smokeless tobacco history on file. He reports that he drinks alcohol. He reports that he uses illicit drugs (Marijuana).  Additional Social History:  Alcohol / Drug Use Pain Medications: None Prescriptions: N/A Over the Counter: N/A Substance #1 Name of Substance 1: Marijuana 1 - Age of First Use: 23 years of age 58 - Amount (size/oz): 3 grams/W 1 - Frequency: Daily use 1 - Duration: On-going 1 - Last Use / Amount: Reports that two weeks ago was last use  CIWA: CIWA-Ar BP: 142/82 mmHg Pulse Rate:  94 COWS:    Allergies: No Known Allergies  Home Medications:  (Not in a hospital admission)  OB/GYN Status:  No LMP for male patient.  General Assessment Data Location of Assessment: [facility] ED Is this a Tele or Face-to-Face Assessment?: Face-to-Face Is this an Initial Assessment or a Re-assessment for this encounter?: Initial Assessment Living Arrangements: Other (Comment) (Pt reports that he is homeless) Can pt return to current living arrangement?: Yes Admission Status: Involuntary Is patient capable of signing voluntary admission?: No Transfer from: Acute Hospital Referral Source: Self/Family/Friend      [facility] Living Arrangements: Other (Comment) (Pt reports that he is homeless) Name of Psychiatrist: N/A Name of Therapist: N/a     Risk to self Suicidal Ideation: No Suicidal Intent: No Is patient at risk for suicide?: Yes Suicidal Plan?: No-Not Currently/Within Last 6 Months (Had said he would hang self.) Access to Means: Yes Specify Access to Suicidal Means: Could get rope What has been your use of drugs/alcohol within the last 12 months?: THC use.  + for ETOH Previous Attempts/Gestures: Yes How many times?: 2 Other Self Harm Risks: N/A Triggers for Past Attempts: None known Intentional Self Injurious Behavior: Cutting (Reports last cutting was 6 months ago) Comment - Self Injurious Behavior: Cutting. Family Suicide History: No Recent stressful life event(s): Conflict (Comment) (Girlfriend and he argue) Persecutory voices/beliefs?: No Depression: Yes Depression Symptoms: Despondent;Insomnia;Isolating;Guilt;Loss of interest in usual pleasures Substance abuse history and/or treatment for substance abuse?: Yes Suicide prevention information given to non-admitted patients: Not applicable  Risk to Others Homicidal Ideation: No Thoughts of Harm to Others: No Current Homicidal Intent: No Current Homicidal Plan: No Access to Homicidal Means: No Identified Victim: No one History of harm to others?: No Assessment of Violence: On admission Violent Behavior Description: Pt had to be restrained in ED Does patient have access to weapons?: No Criminal Charges Pending?: No Does patient have a court date: No  Psychosis Hallucinations: None noted Delusions: None noted  Mental Status Report Appear/Hygiene: Disheveled;In scrubs Eye Contact: Poor Motor Activity: Freedom of movement;Unremarkable Speech: Logical/coherent Level of Consciousness: Drowsy Mood: Depressed;Anxious;Helpless;Sad Affect: Blunted;Depressed Anxiety Level: Panic Attacks Panic attack frequency:  "sometimes" Most recent panic attack: Today Thought Processes: Coherent;Relevant Judgement: Impaired Orientation: Person;Place;Situation Obsessive Compulsive Thoughts/Behaviors: None  Cognitive Functioning Concentration: Decreased Memory: Recent Impaired;Remote Intact IQ: Average Insight: Poor Impulse Control: Poor Appetite: Fair Weight Loss: 0 Weight Gain: 0 Sleep: Decreased Total Hours of Sleep:  (<5H/D) Vegetative Symptoms: None  ADLScreening [facility] Assessment Services) Patient's cognitive ability adequate to safely complete daily activities?: Yes Patient able to express need for assistance with ADLs?: Yes Independently performs ADLs?: Yes (appropriate for developmental age)  Prior Inpatient Therapy Prior Inpatient Therapy: Yes Prior Therapy Dates: Oct '14 Prior Therapy Facilty/Provider(s): [facility] Reason for Treatment: Depression, sI  Prior Outpatient Therapy Prior Outpatient Therapy: No Prior Therapy Dates: None currrently Prior Therapy Facilty/Provider(s): None currently  Reason for Treatment: N/A  ADL Screening (condition at time of admission) Patient's cognitive ability adequate to safely complete daily activities?: Yes Is the patient deaf or have difficulty hearing?: No Does the patient have difficulty seeing, even when wearing glasses/contacts?: Yes (Pt wears glasses.) Does the patient have difficulty concentrating, remembering, or making decisions?: No Patient able to express need for assistance with ADLs?: Yes Does the patient have difficulty dressing or bathing?: No Independently performs ADLs?: Yes (appropriate for developmental age) Does the patient have difficulty walking or climbing stairs?: No Weakness of Legs:  None Weakness of Arms/Hands: None  Home Assistive Devices/Equipment Home Assistive Devices/Equipment: None    Abuse/Neglect Assessment (Assessment to be complete while patient is alone) Physical Abuse: Denies Verbal  Abuse: Denies Sexual Abuse: Denies Exploitation of patient/patient's resources: Denies Self-Neglect: Denies Values / Beliefs Cultural Requests During Hospitalization: None Spiritual Requests During Hospitalization: None   Advance Directives (For Healthcare) Advance Directive: Patient does not have advance directive;Patient would not like information Pre-existing out of facility DNR order (yellow form or pink MOST form): No    Additional Information 1:1 In Past 12 Months?: No CIRT Risk: No Elopement Risk: No Does patient have medical clearance?: Yes     Disposition:  Disposition Initial Assessment Completed for this Encounter: Yes Disposition of Patient: Inpatient treatment program;Referred to Type of inpatient treatment program: Adult Patient referred to:  (To be seen by psychiatry in AM on 06/21)  On Site Evaluation by:   Reviewed with Physician:    Beatriz StallionHarvey, Merdith Adan Ray 12/21/2013 5:09 AM

## 2014-08-19 ENCOUNTER — Emergency Department (INDEPENDENT_AMBULATORY_CARE_PROVIDER_SITE_OTHER)
Admission: EM | Admit: 2014-08-19 | Discharge: 2014-08-19 | Disposition: A | Discharge status: Home / Self Care | Payer: Self-pay | Source: Home / Self Care | Attending: Family Medicine | Admitting: Family Medicine

## 2014-08-19 ENCOUNTER — Encounter: Payer: Self-pay | Admitting: *Deleted

## 2014-08-19 DIAGNOSIS — J209 Acute bronchitis, unspecified: Secondary | ICD-10-CM

## 2014-08-19 LAB — POCT RAPID STREP A (OFFICE): Rapid Strep A Screen: NEGATIVE

## 2014-08-19 MED ORDER — BENZONATATE 200 MG PO CAPS: 200.0000 mg | ORAL_CAPSULE | Freq: Every day | ORAL | Status: DC

## 2014-08-19 MED ORDER — PREDNISONE 20 MG PO TABS: 20.0000 mg | ORAL_TABLET | Freq: Two times a day (BID) | ORAL | Status: DC

## 2014-08-19 MED ORDER — AZITHROMYCIN 250 MG PO TABS: ORAL_TABLET | ORAL | Status: DC

## 2014-08-19 NOTE — Discharge Instructions (Signed)
Take plain guaifenesin 1200mg  (Mucinex) twice daily, with plenty of water, for cough and congestion.  May add Pseudoephedrine for sinus congestion.  Get adequate rest.   For sinus congestion may also use Afrin nasal spray (or generic oxymetazoline) twice daily for about 5 days.  Also recommend using saline nasal spray several times daily and saline nasal irrigation (AYR is a common brand) Try warm salt water gargles for sore throat.  Stop all antihistamines for now, and other non-prescription cough/cold preparations.   Follow-up with family doctor if not improving about10 days.

## 2014-08-19 NOTE — ED Provider Notes (Signed)
CSN: [id number]     Arrival date & time 08/19/14  1327 History   First MD Initiated Contact with Patient 08/19/14 1347     Chief Complaint  Patient presents with  . Cough  . Sore Throat      HPI Comments: Three days ago patient had nausea briefly and vomited twice, followed by an episode of loose stools.  He then developed typical cold-like symptoms including mild sore throat, sinus congestion, fever, fatigue, and cough.  He has had shortness of breath with activity. He has a past history of childhood asthma. Family history of asthma in brother and mother.  The history is provided by the patient.    Past Medical History  Diagnosis Date  . Asthma   . ADD (attention deficit disorder)   . ADHD (attention deficit hyperactivity disorder)   . Disassociation disorder   . Manic depression    Past Surgical History  Procedure Laterality Date  . Mole removal     History reviewed. No pertinent family history. History  Substance Use Topics  . Smoking status: Current Every Day Smoker -- 0.25 packs/day  . Smokeless tobacco: Not on file  . Alcohol Use: Yes    Review of Systems + sore throat + cough No pleuritic pain No wheezing + nasal congestion + post-nasal drainage No sinus pain/pressure No itchy/red eyes No earache No hemoptysis + SOB + fever, + chills No nausea + vomiting, resolved No abdominal pain + diarrhea, resolved No urinary symptoms No skin rash + fatigue No myalgias No headache Used OTC meds without relief  Allergies  Review of patient's allergies indicates no known allergies.  Home Medications   Prior to Admission medications   Medication Sig Start Date End Date Taking? Authorizing Provider  azithromycin (ZITHROMAX Z-PAK) 250 MG tablet Take 2 tabs today; then begin one tab once daily for 4 more days. 08/19/14   Lattie Haw, MD  benzonatate (TESSALON) 200 MG capsule Take 1 capsule (200 mg total) by mouth at bedtime. Take as needed for cough 08/19/14    Lattie Haw, MD  ibuprofen (ADVIL,MOTRIN) 600 MG tablet Take 1 tablet (600 mg total) by mouth every 6 (six) hours as needed. 12/13/13   Toy Baker, MD  predniSONE (DELTASONE) 20 MG tablet Take 1 tablet (20 mg total) by mouth 2 (two) times daily. Take with food. 08/19/14   Lattie Haw, MD   BP 108/71 mmHg  Pulse 99  Temp(Src) 98.4 F (36.9 C) (Oral)  Resp 16  Ht  (1.702 m)  Wt 168 lb (76.204 kg)  BMI 26.31 kg/m2  SpO2 95% Physical Exam Nursing notes and Vital Signs reviewed. Appearance:  Patient appears stated age, and in no acute distress Eyes:  Pupils are equal, round, and reactive to light and accomodation.  Extraocular movement is intact.  Conjunctivae are not inflamed  Ears:  Canals normal.  Tympanic membranes normal.  Nose:  Mildly congested turbinates.  No sinus tenderness.  Pharynx:  Normal Neck:  Supple.   No adenopathy Lungs: Bilateral expiratory wheezes.   Breath sounds are equal.  Heart:  Regular rate and rhythm without murmurs, rubs, or gallops.  Abdomen:  Nontender without masses or hepatosplenomegaly.  Bowel sounds are present.  No CVA or flank tenderness.  Extremities:  No edema.  No calf tenderness Skin:  No rash present.   ED Course  Procedures  None    Labs Reviewed  POCT RAPID STREP A (OFFICE): negative  MDM   1. Bronchitis, acute, with bronchospasm; ?pertussis    Begin Z-pack for atypical coverage.  Begin prednisone burst.  Prescription written for Benzonatate (Tessalon) to take at bedtime for night-time cough.  Take plain guaifenesin 1200mg  (Mucinex) twice daily, with plenty of water, for cough and congestion.  May add Pseudoephedrine for sinus congestion.  Get adequate rest.   For sinus congestion may also use Afrin nasal spray (or generic oxymetazoline) twice daily for about 5 days.  Also recommend using saline nasal spray several times daily and saline nasal irrigation (AYR is a common brand) Try warm salt water gargles for  sore throat.  Stop all antihistamines for now, and other non-prescription cough/cold preparations.   Follow-up with family doctor if not improving about10 days.     Lattie HawStephen A Beese, MD 08/25/14 [phone]

## 2014-08-19 NOTE — ED Notes (Signed)
Pt c/o non productive cough, sore throat, diarrhea and hot/cold flashes x 3 days.

## 2016-12-26 ENCOUNTER — Emergency Department (INDEPENDENT_AMBULATORY_CARE_PROVIDER_SITE_OTHER)
Admission: EM | Admit: 2016-12-26 | Discharge: 2016-12-26 | Disposition: A | Discharge status: Home / Self Care | Payer: Self-pay | Source: Home / Self Care | Attending: Family Medicine | Admitting: Family Medicine

## 2016-12-26 ENCOUNTER — Encounter: Payer: Self-pay | Admitting: *Deleted

## 2016-12-26 DIAGNOSIS — R55 Syncope and collapse: Secondary | ICD-10-CM

## 2016-12-26 DIAGNOSIS — R1032 Left lower quadrant pain: Secondary | ICD-10-CM

## 2016-12-26 LAB — POCT CBC W AUTO DIFF (K'VILLE URGENT CARE)

## 2016-12-26 LAB — POCT URINALYSIS DIP (MANUAL ENTRY)
Bilirubin, UA: NEGATIVE
Glucose, UA: NEGATIVE mg/dL
Ketones, POC UA: NEGATIVE mg/dL
Nitrite, UA: NEGATIVE
Protein Ur, POC: NEGATIVE mg/dL
Spec Grav, UA: 1.015 (ref 1.010–1.025)
Urobilinogen, UA: 0.2 E.U./dL
pH, UA: 7 (ref 5.0–8.0)

## 2016-12-26 NOTE — ED Provider Notes (Signed)
CSN: [id number]     Arrival date & time 12/26/16  1803 History   First MD Initiated Contact with Patient 12/26/16 1821     Chief Complaint  Patient presents with  . Abdominal Pain   (Consider location/radiation/quality/duration/timing/severity/associated sxs/prior Treatment) HPI Leslie Fowler is a 26 y.o. male presenting to UC with c/o sudden onset sharp stabbing pain in LLQ and suprapubic region of abdomen that occurred around 5PM.  Pt notes the pain caused him to be doubled over. It gradually improved enough for him to continue onto work but then felt like Leslie Fowler was about to pass out so Leslie Fowler laid down for a few minutes before coming to UC for evaluation. Denies fever, chills, n/v/d. Denies hx of kidney stones. No hx of abdominal surgeries.  No urinary symptoms.    Past Medical History:  Diagnosis Date  . ADD (attention deficit disorder)   . ADHD (attention deficit hyperactivity disorder)   . Asthma   . Disassociation disorder   . Manic depression (HCC)    Past Surgical History:  Procedure Laterality Date  . MOLE REMOVAL     History reviewed. No pertinent family history. Social History  Substance Use Topics  . Smoking status: Current Every Day Smoker    Packs/day: 0.50    Types: Cigarettes  . Smokeless tobacco: Never Used  . Alcohol use Yes     Comment: 2 q wk    Review of Systems  Constitutional: Negative for chills and fever.  HENT: Negative for congestion, ear pain, sore throat, trouble swallowing and voice change.   Respiratory: Negative for cough and shortness of breath.   Cardiovascular: Negative for chest pain and palpitations.  Gastrointestinal: Positive for abdominal pain (LLQ and suprapubic). Negative for diarrhea, nausea and vomiting.  Genitourinary: Negative for dysuria, flank pain, frequency, hematuria, penile pain, penile swelling, scrotal swelling, testicular pain and urgency.  Musculoskeletal: Negative for arthralgias, back pain and myalgias.  Skin:  Negative for rash.  Neurological: Positive for dizziness, weakness and light-headedness. Negative for syncope.       Near syncope    Allergies  Patient has no known allergies.  Home Medications   Prior to Admission medications   Not on File   Meds Ordered and Administered this Visit  Medications - No data to display  BP 117/77 (BP Location: Left Arm)   Pulse 83   Temp 97.7 F (36.5 C) (Oral)   Resp 16   Ht 5\' 7"  (1.702 m)   Wt 180 lb (81.6 kg)   SpO2 99%   BMI 28.19 kg/m  No data found.   Physical Exam  Constitutional: Leslie Fowler is oriented to person, place, and time. Leslie Fowler appears well-developed and well-nourished.  Pt lying on exam bed holding Left lower abdomen. Appears uncomfortable but is alert and cooperative  HENT:  Head: Normocephalic and atraumatic.  Mouth/Throat: Oropharynx is clear and moist.  Eyes: EOM are normal.  Neck: Normal range of motion.  Cardiovascular: Normal rate and regular rhythm.   Pulmonary/Chest: Effort normal and breath sounds normal. No respiratory distress. Leslie Fowler has no wheezes. Leslie Fowler has no rales.  Abdominal: Soft. Leslie Fowler exhibits no distension and no mass. There is tenderness. There is guarding. There is no rebound and no CVA tenderness. No hernia.    Musculoskeletal: Normal range of motion.  Neurological: Leslie Fowler is alert and oriented to person, place, and time.  Skin: Skin is warm and dry. Leslie Fowler is not diaphoretic.  Psychiatric: Leslie Fowler has a normal mood and affect. His  behavior is normal.  Nursing note and vitals reviewed.   Urgent Care Course     Procedures (including critical care time)  Labs Review Labs Reviewed  POCT URINALYSIS DIP (MANUAL ENTRY) - Abnormal; Notable for the following:       Result Value   Blood, UA trace-intact (*)    Leukocytes, UA Trace (*)    All other components within normal limits  COMPLETE METABOLIC PANEL WITH GFR  LIPASE  POCT CBC W AUTO DIFF ([facility])    Imaging Review No results found.    MDM   1.  LLQ abdominal pain   2. Near syncope    Pt c/o sudden onset LLQ abdominal pain that started just PTA, associated near syncopal episode.    Tenderness to LLQ and suprapubic region on exam. Initial guarding then no guarding or rebound on recheck. No CVAT  CBC: unremarkable CMP and Lipase: pending  UA: trace blood  Pain could be due to renal stone, however, concern for severity of pain and near syncopal episode. Recommend pt go to ED this evening for imaging. Pt declined due to financial concern. Discussed symptoms that warrant emergent care in the ED. Pt plans to return to [facility] tomorrow for imaging if pain not improving. Encouraged to stick with a bland diet this evening.     Lurene Shadow, [place] 12/26/16 1939

## 2016-12-26 NOTE — ED Triage Notes (Signed)
Pt c/o sudden LLQ and pain with near syncope x 1700 today.

## 2016-12-27 ENCOUNTER — Telehealth: Payer: Self-pay | Admitting: *Deleted

## 2016-12-27 LAB — COMPLETE METABOLIC PANEL WITH GFR
ALT: 17 U/L (ref 9–46)
AST: 23 U/L (ref 10–40)
Albumin: 4.6 g/dL (ref 3.6–5.1)
Alkaline Phosphatase: 53 U/L (ref 40–115)
BUN: 13 mg/dL (ref 7–25)
CO2: 30 mmol/L (ref 20–31)
Calcium: 9.4 mg/dL (ref 8.6–10.3)
Chloride: 101 mmol/L (ref 98–110)
Creat: 1.14 mg/dL (ref 0.60–1.35)
GFR, Est African American: >89 mL/min (ref >=60)
GFR, Est Non African American: 89 mL/min (ref >=60)
Glucose, Bld: 86 mg/dL (ref 65–99)
Potassium: 4.7 mmol/L (ref 3.5–5.3)
Sodium: 136 mmol/L (ref 135–146)
Total Bilirubin: 0.6 mg/dL (ref 0.2–1.2)
Total Protein: 6.9 g/dL (ref 6.1–8.1)

## 2016-12-27 LAB — LIPASE: Lipase: 17 U/L (ref 7–60)

## 2016-12-27 NOTE — Telephone Encounter (Signed)
LM with lab results, to call back to let us know how he is feeling today and if a CT scan is still needed.

## 2021-10-02 ENCOUNTER — Emergency Department (HOSPITAL_COMMUNITY)
Admission: EM | Admit: 2021-10-02 | Discharge: 2021-10-02 | Disposition: A | Discharge status: Home / Self Care | Payer: Self-pay | Attending: Emergency Medicine | Admitting: Emergency Medicine

## 2021-10-02 ENCOUNTER — Encounter (HOSPITAL_COMMUNITY): Payer: Self-pay

## 2021-10-02 ENCOUNTER — Encounter (HOSPITAL_BASED_OUTPATIENT_CLINIC_OR_DEPARTMENT_OTHER): Payer: Self-pay | Admitting: Emergency Medicine

## 2021-10-02 ENCOUNTER — Emergency Department (HOSPITAL_BASED_OUTPATIENT_CLINIC_OR_DEPARTMENT_OTHER)
Admission: EM | Admit: 2021-10-02 | Discharge: 2021-10-02 | Disposition: A | Discharge status: Home / Self Care | Payer: Self-pay | Attending: Emergency Medicine | Admitting: Emergency Medicine

## 2021-10-02 ENCOUNTER — Other Ambulatory Visit: Payer: Self-pay

## 2021-10-02 DIAGNOSIS — K0889 Other specified disorders of teeth and supporting structures: Secondary | ICD-10-CM | POA: Insufficient documentation

## 2021-10-02 MED ORDER — KETOROLAC TROMETHAMINE 60 MG/2ML IM SOLN
60.0000 mg | Freq: Once | INTRAMUSCULAR | Status: AC
  Administered 2021-10-02: 60 mg via INTRAMUSCULAR
  Filled 2021-10-02: qty 2

## 2021-10-02 MED ORDER — KETOROLAC TROMETHAMINE 15 MG/ML IJ SOLN
15.0000 mg | Freq: Once | INTRAMUSCULAR | Status: AC
  Administered 2021-10-02: 15 mg via INTRAMUSCULAR
  Filled 2021-10-02: qty 1

## 2021-10-02 MED ORDER — OXYCODONE-ACETAMINOPHEN 5-325 MG PO TABS: 1.0000 | ORAL_TABLET | Freq: Four times a day (QID) | ORAL | 0 refills | Status: AC | PRN

## 2021-10-02 MED ORDER — IBUPROFEN 600 MG PO TABS: 600.0000 mg | ORAL_TABLET | Freq: Three times a day (TID) | ORAL | 0 refills | Status: AC | PRN

## 2021-10-02 MED ORDER — OXYCODONE-ACETAMINOPHEN 5-325 MG PO TABS: 1.0000 | ORAL_TABLET | Freq: Four times a day (QID) | ORAL | 0 refills | Status: DC | PRN

## 2021-10-02 MED ORDER — PENICILLIN V POTASSIUM 500 MG PO TABS: 500.0000 mg | ORAL_TABLET | Freq: Four times a day (QID) | ORAL | 0 refills | Status: AC

## 2021-10-02 MED ORDER — HYDROCODONE-ACETAMINOPHEN 5-325 MG PO TABS: 2.0000 | ORAL_TABLET | Freq: Once | ORAL | Status: DC

## 2021-10-02 NOTE — ED Provider Notes (Signed)
?[facility] EMERGENCY DEPT ?Provider Note ? ? ?CSN: [id number] ?Arrival date & time: 10/02/21  3335 ? ?  ? ?History ? ?Chief Complaint  ?Patient presents with  ? Dental Pain  ? ? ?Leslie Fowler is a 31 y.o. male. ? ? ?Dental Pain ? ?31 year old male presenting to the emergency department with right upper molar dental pain.  The patient states that his top right molar is fractured.  He spit a chunk of the tooth out a week ago on the right side.  He has no insurance and has not been able to follow-up with a dentist.  He is requesting pain control.  He was seen overnight at the [facility] emergency department, diagnosed with a broken right upper molar, prescribed penicillin V tablets and subsequently discharged.  He states that the pain has been unbearable after initially improving with IM Toradol. ? ?Home Medications ?Prior to Admission medications   ?Medication Sig Start Date End Date Taking? Authorizing Provider  ?oxyCODONE-acetaminophen (PERCOCET/ROXICET) 5-325 MG tablet Take 1 tablet by mouth every 6 (six) hours as needed for severe pain. 10/02/21  Yes Ernie Avena, MD  ?ibuprofen (ADVIL) 600 MG tablet Take 1 tablet (600 mg total) by mouth every 8 (eight) hours as needed. 10/02/21   Placido Sou, PA-C  ?penicillin v potassium (VEETID) 500 MG tablet Take 1 tablet (500 mg total) by mouth 4 (four) times daily for 7 days. 10/02/21 10/09/21  Placido Sou, PA-C  ?   ? ?Allergies    ?Patient has no known allergies.   ? ?Review of Systems   ?Review of Systems  ?HENT:  Positive for dental problem.   ?All other systems reviewed and are negative. ? ?Physical Exam ?Updated Vital Signs ?BP (!) 159/100   Pulse (!) 50   Temp 97.6 ?F (36.4 ?C) (Oral)   Resp 16   SpO2 100%  ?Physical Exam ?Vitals and nursing note reviewed.  ?Constitutional:   ?   General: He is not in acute distress. ?HENT:  ?   Head: Normocephalic and atraumatic.  ?   Mouth/Throat:  ? ?   Comments: Uvula midline, no elevation of the  tongue.  No tenderness or swelling about the gumline.  Right upper molar appears to be fractured.  No palpable abscess or other gingival swelling.   ?Eyes:  ?   Conjunctiva/sclera: Conjunctivae normal.  ?   Pupils: Pupils are equal, round, and reactive to light.  ?Cardiovascular:  ?   Rate and Rhythm: Normal rate and regular rhythm.  ?Pulmonary:  ?   Effort: Pulmonary effort is normal. No respiratory distress.  ?Abdominal:  ?   General: There is no distension.  ?   Tenderness: There is no guarding.  ?Musculoskeletal:     ?   General: No deformity or signs of injury.  ?   Cervical back: Neck supple.  ?Skin: ?   Findings: No lesion or rash.  ?Neurological:  ?   General: No focal deficit present.  ?   Mental Status: He is alert. Mental status is at baseline.  ? ? ?ED Results / Procedures / Treatments   ?Labs ?(all labs ordered are listed, but only abnormal results are displayed) ?Labs Reviewed - No data to display ? ?EKG ?None ? ?Radiology ?No results found. ? ?Procedures ?Procedures  ? ? ?Medications Ordered in ED ?Medications  ?HYDROcodone-acetaminophen (NORCO/VICODIN) 5-325 MG per tablet 2 tablet (has no administration in time range)  ?ketorolac (TORADOL) injection 60 mg (has no administration in time range)  ? ? ?  ED Course/ Medical Decision Making/ A&P ?  ?                        ?Medical Decision Making ?Risk ?Prescription drug management. ? ? ?31 year old male presenting to the emergency department with right upper molar dental pain.  The patient states that his top right molar is fractured.  He spit a chunk of the tooth out a week ago on the right side.  He has no insurance and has not been able to follow-up with a dentist.  He is requesting pain control.  He was seen overnight at the [facility] emergency department, diagnosed with a broken right upper molar, prescribed penicillin V tablets and subsequently discharged.  He states that the pain has been unbearable after initially improving with IM Toradol. ? ?On  arrival, vitally stable.  Presenting with a fractured right upper molar.  No clear evidence for abscess.  Previously prescribed penicillin and advised to follow-up with a dentist.  Will provide IM Toradol and Norco for pain control.  Stable for discharge.  Dental resources provided. ? ? ?Final Clinical Impression(s) / ED Diagnoses ?Final diagnoses:  ?Pain, dental  ? ? ?Rx / DC Orders ?ED Discharge Orders   ? ?      Ordered  ?  oxyCODONE-acetaminophen (PERCOCET/ROXICET) 5-325 MG tablet  Every 6 hours PRN       ? 10/02/21 1006  ? ?  ?  ? ?  ? ? ?  ?Ernie Avena, MD ?10/02/21 1009 ? ?

## 2021-10-02 NOTE — ED Triage Notes (Signed)
Pt given shot of toradol at [facility], helped for the time, but awoke withsevere pain this am.  ?

## 2021-10-02 NOTE — ED Provider Notes (Signed)
?[facility]-EMERGENCY DEPT ?Provider Note ? ? ?CSN: [id number] ?Arrival date & time: 10/02/21  0304 ? ?  ? ?History ? ?Chief Complaint  ?Patient presents with  ? Dental Pain  ? ? ?Leslie Fowler is a 31 y.o. male. ? ?HPI ?Patient is a 31 year old male who presents to the emergency department due to right upper dental pain.  Patient states that about 1 week ago he was eating and a piece of a molar broke off.  He had mild tenderness in the region but states that over the past few days he began to significantly worsen.  Now reports significant pain with any chewing.  Reports difficulty sleeping due to his pain.  Denies any fevers, chills, nausea, vomiting, sore throat, difficulty swallowing. ?  ? ?Home Medications ?Prior to Admission medications   ?Medication Sig Start Date End Date Taking? Authorizing Provider  ?ibuprofen (ADVIL) 600 MG tablet Take 1 tablet (600 mg total) by mouth every 8 (eight) hours as needed. 10/02/21  Yes Placido Sou, PA-C  ?penicillin v potassium (VEETID) 500 MG tablet Take 1 tablet (500 mg total) by mouth 4 (four) times daily for 7 days. 10/02/21 10/09/21 Yes Placido Sou, PA-C  ?   ?Allergies    ?Patient has no known allergies.   ? ?Review of Systems   ?Review of Systems  ?Constitutional:  Negative for chills and fever.  ?HENT:  Positive for dental problem. Negative for sore throat and trouble swallowing.   ?Gastrointestinal:  Negative for nausea and vomiting.  ? ?Physical Exam ?Updated Vital Signs ?BP (!) 156/98 (BP Location: Left Arm)   Pulse 73   Temp 97.9 ?F (36.6 ?C) (Oral)   Resp 16   Ht 5\' 7"  (1.702 m)   Wt 82 kg   SpO2 100%   BMI 28.31 kg/m?  ?Physical Exam ?Vitals and nursing note reviewed.  ?Constitutional:   ?   General: He is not in acute distress. ?   Appearance: He is well-developed.  ?HENT:  ?   Head: Normocephalic and atraumatic.  ?   Right Ear: External ear normal.  ?   Left Ear: External ear normal.  ?   Mouth/Throat:  ?   Comments: Uvula  midline.  No erythema noted in the posterior oropharynx.  Readily handling secretions.  No drooling.  Soft submental compartments.  Right upper second molar appears to be broken.  Moderate tenderness with manipulation of the tooth.  No surrounding erythema, edema, or palpable fluctuance noted. ?Eyes:  ?   General: No scleral icterus.    ?   Right eye: No discharge.     ?   Left eye: No discharge.  ?   Conjunctiva/sclera: Conjunctivae normal.  ?Neck:  ?   Trachea: No tracheal deviation.  ?Cardiovascular:  ?   Rate and Rhythm: Normal rate.  ?Pulmonary:  ?   Effort: Pulmonary effort is normal. No respiratory distress.  ?   Breath sounds: No stridor.  ?Abdominal:  ?   General: Abdomen is flat. There is no distension.  ?Musculoskeletal:     ?   General: No swelling or deformity.  ?   Cervical back: Neck supple.  ?Skin: ?   General: Skin is warm and dry.  ?   Findings: No rash.  ?Neurological:  ?   Mental Status: He is alert.  ?   Cranial Nerves: Cranial nerve deficit: no gross deficits.  ? ?ED Results / Procedures / Treatments   ?Labs ?(all labs ordered are listed,  but only abnormal results are displayed) ?Labs Reviewed - No data to display ? ?EKG ?None ? ?Radiology ?No results found. ? ?Procedures ?Procedures  ? ?Medications Ordered in ED ?Medications  ?ketorolac (TORADOL) 15 MG/ML injection 15 mg (15 mg Intramuscular Given 10/02/21 0321)  ? ? ?ED Course/ Medical Decision Making/ A&P ?  ?                        ?Medical Decision Making ?Risk ?Prescription drug management. ? ?Patient is a 31 year old male who presents to the emergency department with a broken right upper molar.  Uvula midline.  Readily handling secretions.  Soft submental compartments.  Moderate pain with manipulation of the affected tooth.  No surrounding erythema, edema, or palpable fluctuance warranting I&D.  Afebrile and nontachycardic.  Denies any systemic complaints such as fevers, chills, nausea, or vomiting.  Doubt Ludwig's angina or PTA at this  time. ? ?Patient given a dose of IM Toradol for his acute pain.  Will discharge on a course of ibuprofen as well as penicillin VK.  Patient given a referral to a local dental provider.  We discussed return precautions.  His questions were answered and he was amicable at the time of discharge. ?Final Clinical Impression(s) / ED Diagnoses ?Final diagnoses:  ?Pain, dental  ? ?Rx / DC Orders ?ED Discharge Orders   ? ?      Ordered  ?  ibuprofen (ADVIL) 600 MG tablet  Every 8 hours PRN       ? 10/02/21 0318  ?  penicillin v potassium (VEETID) 500 MG tablet  4 times daily       ? 10/02/21 0318  ? ?  ?  ? ?  ? ? ?  ?Placido Sou, PA-C ?10/02/21 0329 ? ?  ?Geoffery Lyons, MD ?10/02/21 0430 ? ?

## 2021-10-02 NOTE — ED Notes (Signed)
Vicodin not given as patient is driving himself and endorses marijuana use this am, heart rate 49. Educated on prescription and cautioned not to drive with narcotic use or marijuana. Dc instructions reviewed with patient. Patient voiced understanding. Dc with belongings.  ?

## 2021-10-02 NOTE — ED Triage Notes (Signed)
Patient said he spent a chunk of a tooth out a week ago on the right side. He said he has no insurance and cant sleep/work/eat. Top right molar. Wisdom tooth came in behind it.  ?

## 2021-10-02 NOTE — Discharge Instructions (Addendum)
Recommend you follow-up with a dentist this week. Take your antibiotics as prescribed. ?

## 2021-10-02 NOTE — ED Triage Notes (Addendum)
Patient said he spit a chunk of a tooth out a week ago on the right side. He said he has no insurance and cant sleep/work/eat. Top right molar. Wisdom tooth came in behind it.  ?

## 2021-10-02 NOTE — Discharge Instructions (Addendum)
I am prescribing you 600 mg ibuprofen.  You can take these up to 3 times per day.  Try to take them with a small amount of food to help prevent stomach irritation. ? ?I am also prescribing you penicillin.  Please take this 4 times per day for the next 7 days.  Do not stop taking this early even if you feel your symptoms have resolved. ? ?I would also recommend salt water gargles 1-2 times per day.  Also consider applying Orajel to the region what you are experiencing pain. ? ?Below is the contact information for Dr. Mia Creek.  He is a Radiation protection practitioner.  Please give him a call on Monday morning to schedule an appointment for reevaluation. ? ?You develop any new or worsening symptoms please come back to the emergency department. ?

## 2021-11-02 ENCOUNTER — Other Ambulatory Visit: Payer: Self-pay

## 2021-11-02 ENCOUNTER — Emergency Department (HOSPITAL_COMMUNITY)
Admission: EM | Admit: 2021-11-02 | Discharge: 2021-11-02 | Discharge status: Against Medical Advice | Payer: Self-pay | Attending: Student | Admitting: Student

## 2021-11-02 ENCOUNTER — Emergency Department (HOSPITAL_COMMUNITY): Payer: Self-pay

## 2021-11-02 ENCOUNTER — Encounter (HOSPITAL_COMMUNITY): Payer: Self-pay | Admitting: *Deleted

## 2021-11-02 DIAGNOSIS — Z23 Encounter for immunization: Secondary | ICD-10-CM | POA: Insufficient documentation

## 2021-11-02 DIAGNOSIS — M79671 Pain in right foot: Secondary | ICD-10-CM | POA: Insufficient documentation

## 2021-11-02 DIAGNOSIS — R200 Anesthesia of skin: Secondary | ICD-10-CM | POA: Insufficient documentation

## 2021-11-02 DIAGNOSIS — Z5321 Procedure and treatment not carried out due to patient leaving prior to being seen by health care provider: Secondary | ICD-10-CM | POA: Insufficient documentation

## 2021-11-02 MED ORDER — TETANUS-DIPHTH-ACELL PERTUSSIS 5-2.5-18.5 LF-MCG/0.5 IM SUSY
0.5000 mL | PREFILLED_SYRINGE | Freq: Once | INTRAMUSCULAR | Status: AC
  Administered 2021-11-02: 0.5 mL via INTRAMUSCULAR
  Filled 2021-11-02: qty 0.5

## 2021-11-02 NOTE — ED Notes (Signed)
Pt and family stated that the wait was too long and they would go to the urgent care. Pt was seen leaving the ED. ?

## 2021-11-02 NOTE — ED Provider Triage Note (Signed)
Emergency Medicine Provider Triage Evaluation Note ? ?Leslie Fowler , a 31 y.o. male  was evaluated in triage.  Pt complains of extreme wound with associated pain to the bottom of the right foot.  Patient states he fell off his porch yesterday and stepped on a metal planter which had an object that was able to penetrate the bottom of his right foot.  Some numbness in the lateral portion of the right foot.  Patient is able to move all 5 toes.  Patient does have pain.  Unsure of last tetanus shot. ? ?Review of Systems  ?Positive: Puncture wound, right foot pain, numbness ?Negative: Loss of consciousness head injury ? ?Physical Exam  ?There were no vitals taken for this visit. ?Gen:   Awake, no distress   ?Resp:  Normal effort  ?MSK:   Moves extremities without difficulty  ?Other:  Puncture wound noted to the center of the plantar surface of the right foot, mild numbness noted along the lateral surface of the right foot, mild swelling noted ? ?Medical Decision Making  ?Medically screening exam initiated at 6:44 PM.  Appropriate orders placed.  Leslie Fowler was informed that the remainder of the evaluation will be completed by another provider, this initial triage assessment does not replace that evaluation, and the importance of remaining in the ED until their evaluation is complete. ? ? ?  ?Darrick Grinder, PA-C ?11/02/21 1846 ? ?

## 2021-11-02 NOTE — ED Triage Notes (Signed)
The pt is c/o rt foot pain he stepped off his porch and onto a metal spike  red sl swollen ?

## 2022-11-07 ENCOUNTER — Encounter (INDEPENDENT_AMBULATORY_CARE_PROVIDER_SITE_OTHER): Payer: Self-pay | Admitting: Primary Care

## 2022-11-07 ENCOUNTER — Ambulatory Visit (INDEPENDENT_AMBULATORY_CARE_PROVIDER_SITE_OTHER): Payer: Medicaid Other | Admitting: Primary Care

## 2022-11-07 VITALS — BP 129/68 | HR 86 | Resp 16 | Ht 67.0 in | Wt 155.4 lb

## 2022-11-07 DIAGNOSIS — Z5900 Homelessness unspecified: Secondary | ICD-10-CM | POA: Diagnosis not present

## 2022-11-07 DIAGNOSIS — Z7689 Persons encountering health services in other specified circumstances: Secondary | ICD-10-CM

## 2022-11-07 DIAGNOSIS — Z1159 Encounter for screening for other viral diseases: Secondary | ICD-10-CM

## 2022-11-07 DIAGNOSIS — F99 Mental disorder, not otherwise specified: Secondary | ICD-10-CM

## 2022-11-07 DIAGNOSIS — Z114 Encounter for screening for human immunodeficiency virus [HIV]: Secondary | ICD-10-CM

## 2022-11-07 NOTE — Progress Notes (Signed)
New Patient Office Visit  Subjective    Patient ID: Leslie Fowler, male    DOB: [birth year]  Age: 32 y.o. MRN: [id number]  CC:  Chief Complaint  Patient presents with   New Patient (Initial Visit)   Anxiety   Depression   Gastroesophageal Reflux    HPI Leslie Fowler is a 32 year old male who presents to establish care. He is very depression estrange relationship with mother , he does get alone with his brother. Today is his first day trying to be an adult and making a new patient appt. Alter personality causes his world to be in "fuck up mode". Crying throughout visit. Patient has No headache, No chest pain, No abdominal pain - No Nausea, No new weakness tingling or numbness, No Cough - shortness of breath. Denies harm to self or others . Homeless- flunk 4 time 9th grade but he did get his GED. Unable to keep a job. He does admit to no purpose in his life.     Outpatient Encounter Medications as of 11/07/2022  Medication Sig   ibuprofen (ADVIL) 600 MG tablet Take 1 tablet (600 mg total) by mouth every 8 (eight) hours as needed. (Patient not taking: Reported on 11/07/2022)   oxyCODONE-acetaminophen (PERCOCET/ROXICET) 5-325 MG tablet Take 1 tablet by mouth every 6 (six) hours as needed for severe pain. (Patient not taking: Reported on 11/07/2022)   No facility-administered encounter medications on file as of 11/07/2022.    Past Medical History:  Diagnosis Date   ADD (attention deficit disorder)    ADHD (attention deficit hyperactivity disorder)    Asthma    Disassociation disorder    Manic depression (HCC)     Past Surgical History:  Procedure Laterality Date   MOLE REMOVAL     No family history on file.  Social History   Socioeconomic History   Marital status: Single    Spouse name: Not on file   Number of children: Not on file   Years of education: Not on file   Highest education level: Not on file  Occupational History   Not on file  Tobacco Use   Smoking  status: Former    Packs/day: .5    Types: Cigarettes   Smokeless tobacco: Never  Vaping Use   Vaping Use: Never used  Substance and Sexual Activity   Alcohol use: Yes    Comment: 12 pack a week   Drug use: Yes    Types: Marijuana    Comment: daily   Sexual activity: Not on file  Other Topics Concern   Not on file  Social History Narrative   Not on file   Social Determinants of Health   Financial Resource Strain: Not on file  Food Insecurity: Not on file  Transportation Needs: Not on file  Physical Activity: Not on file  Stress: Not on file  Social Connections: Not on file  Intimate Partner Violence: Not on file    Objective    Blood Pressure 129/68   Pulse 86   Respiration 16   Height 5\' 7"  (1.702 m)   Weight 155 lb 6.4 oz (70.5 kg)   Oxygen Saturation 98%   Body Mass Index 24.34 kg/m   Physical exam: General: Vital signs reviewed.  Patient is well-developed and well-nourished, wnl in no acute distress and cooperative with exam. Head: Normocephalic and atraumatic. Eyes: EOMI, conjunctivae normal, no scleral icterus. Neck: Supple, trachea midline, normal ROM, no JVD, masses, thyromegaly, or carotid  bruit present. Cardiovascular: RRR, S1 normal, S2 normal, no murmurs, gallops, or rubs. Pulmonary/Chest: Clear to auscultation bilaterally, no wheezes, rales, or rhonchi. Abdominal: Soft, non-tender, non-distended, BS +, no masses, organomegaly, or guarding present. Musculoskeletal: No joint deformities, erythema, or stiffness, ROM full and nontender. Extremities: No lower extremity edema bilaterally,  pulses symmetric and intact bilaterally. No cyanosis or clubbing. Neurological: A&O x3, Strength is normal Skin: Warm, dry and intact. No rashes or erythema. Psychiatric: Normal mood and affect. speech and behavior is normal. Cognition and memory are normal.      Assessment & Plan:  Pistol was seen today for new patient (initial visit), anxiety, depression and  gastroesophageal reflux.  Diagnoses and all orders for this visit:  Encounter to establish care -     CBC with Differential -     CMP14+EGFR  Mental health disorder Depression and anxiety Flowsheet Row Office Visit from 11/07/2022 in [name]  PHQ-9 Total Score 22      Refer to clinical social worker  Encounter for HCV screening test for low risk patient -     HCV Ab w Reflex to Quant PCR  Encounter for screening for HIV -     HIV Antibody (routine testing w rflx)    [name], NP

## 2022-11-08 ENCOUNTER — Telehealth (INDEPENDENT_AMBULATORY_CARE_PROVIDER_SITE_OTHER): Payer: Self-pay | Admitting: Licensed Clinical Social Worker

## 2022-11-08 NOTE — Telephone Encounter (Signed)
Met with patient virtually yesterday while he was at his visit. Pt is having some housing issues, symptoms of depression, as well as other mental health concerns. I have an appointment with him and will navigate his mental health needs. I was able to give him  some resources yesterday as well.

## 2022-11-23 ENCOUNTER — Institutional Professional Consult (permissible substitution) (INDEPENDENT_AMBULATORY_CARE_PROVIDER_SITE_OTHER): Payer: Medicaid Other | Admitting: Licensed Clinical Social Worker

## 2022-11-23 ENCOUNTER — Ambulatory Visit (INDEPENDENT_AMBULATORY_CARE_PROVIDER_SITE_OTHER): Payer: Medicaid Other | Admitting: Primary Care

## 2022-11-30 ENCOUNTER — Encounter (INDEPENDENT_AMBULATORY_CARE_PROVIDER_SITE_OTHER): Payer: Self-pay | Admitting: Primary Care

## 2022-11-30 ENCOUNTER — Ambulatory Visit (INDEPENDENT_AMBULATORY_CARE_PROVIDER_SITE_OTHER): Payer: Medicaid Other | Admitting: Licensed Clinical Social Worker

## 2022-11-30 ENCOUNTER — Ambulatory Visit (INDEPENDENT_AMBULATORY_CARE_PROVIDER_SITE_OTHER): Payer: Medicaid Other | Admitting: Primary Care

## 2022-11-30 VITALS — BP 129/87 | HR 95 | Resp 16 | Wt 161.0 lb

## 2022-11-30 DIAGNOSIS — F4323 Adjustment disorder with mixed anxiety and depressed mood: Secondary | ICD-10-CM | POA: Diagnosis not present

## 2022-11-30 DIAGNOSIS — F99 Mental disorder, not otherwise specified: Secondary | ICD-10-CM | POA: Diagnosis not present

## 2022-11-30 MED ORDER — QUETIAPINE FUMARATE 25 MG PO TABS: 25.0000 mg | ORAL_TABLET | Freq: Every day | ORAL | 1 refills | Status: DC

## 2022-11-30 NOTE — Patient Instructions (Signed)
Quetiapine Tablets What is this medication? QUETIAPINE (kwe TYE a peen) treats schizophrenia and bipolar disorder. It works by balancing the levels of dopamine and serotonin in your brain, hormones that help regulate mood, behaviors, and thoughts. It belongs to a group of medications called antipsychotics. Antipsychotic medications can be used to treat several kinds of mental health conditions. This medicine may be used for other purposes; ask your health care provider or pharmacist if you have questions. COMMON BRAND NAME(S): Seroquel What should I tell my care team before I take this medication? They need to know if you have any of these conditions: Blockage in your bowels Cataracts Constipation Dementia Diabetes Difficulty swallowing Glaucoma Heart disease High levels of prolactin History of breast cancer History of irregular heartbeat Liver disease Low blood cell levels (white cells, red cells, and platelets) Low blood pressure Parkinson disease Prostate disease Seizures Suicidal thoughts, plans, or attempt by you or a family member Thyroid disease Trouble passing urine An unusual or allergic reaction to quetiapine, other medications, foods, dyes, or preservatives Pregnant or trying to get pregnant Breastfeeding How should I use this medication? Take this medication by mouth with water. Take it as directed on the prescription label at the same time every day. You can take it with or without food. If it upsets your stomach, take it with food. Keep taking it unless your care team tells you to stop. A special MedGuide will be given to you by the pharmacist with each prescription and refill. Be sure to read this information carefully each time. Talk to your care team about the use of this medication in children. While this medication may be prescribed for children as young as 10 years for selected conditions, precautions do apply. People over 1 years of age may have a stronger  reaction to this medication and need smaller doses. Overdosage: If you think you have taken too much of this medicine contact a poison control center or emergency room at once. NOTE: This medicine is only for you. Do not share this medicine with others. What if I miss a dose? If you miss a dose, take it as soon as you can. If it is almost time for your next dose, take only that dose. Do not take double or extra doses. What may interact with this medication? Do not take this medication with any of the following: Cisapride Dronedarone Metoclopramide Pimozide Thioridazine This medication may also interact with the following: Alcohol Antihistamines for allergy, cough, and cold Atropine Avasimibe Certain antivirals for HIV or hepatitis Certain medications for anxiety or sleep Certain medications for bladder problems, such as oxybutynin, tolterodine Certain medications for depression, such as amitriptyline, fluoxetine, nefazodone, sertraline Certain medications for fungal infections, such as fluconazole, ketoconazole, itraconazole, posaconazole Certain medications for stomach problems, such as dicyclomine, hyoscyamine Certain medications for travel sickness, such as scopolamine Cimetidine General anesthetics, such as halothane, isoflurane, methoxyflurane, propofol Ipratropium Levodopa or other medications for Parkinson disease Medications for blood pressure Medications for seizures Medications that relax muscles for surgery Opioid medications for pain Other medications that cause heart rhythm changes Phenothiazines, such as chlorpromazine, prochlorperazine Rifampin St. John's wort This list may not describe all possible interactions. Give your health care provider a list of all the medicines, herbs, non-prescription drugs, or dietary supplements you use. Also tell them if you smoke, drink alcohol, or use illegal drugs. Some items may interact with your medicine. What should I watch for  while using this medication? Visit your care team for regular  checks on your progress. Tell your care team if your symptoms do not start to get better or if they get worse. Do not suddenly stop taking This medication. You may develop a severe reaction. Your care team will tell you how much medication to take. If your care team wants you to stop the medication, the dose may be slowly lowered over time to avoid any side effects. You may need to have an eye exam before and during use of this medication. This medication may increase blood sugar. Ask your care team if changes in diet or medications are needed if you have diabetes. This medication may cause thoughts of suicide or depression. This includes sudden changes in mood, behaviors, or thoughts. These changes can happen at any time but are more common in the beginning of treatment or after a change in dose. Call your care team right away if you experience these thoughts or worsening depression. This medication may affect your coordination, reaction time, or judgment. Do not drive or operate machinery until you know how this medication affects you. Sit up or stand slowly to reduce the risk of dizzy or fainting spells. Drinking alcohol with this medication can increase the risk of these side effects. This medication can cause problems with controlling your body temperature. It can lower the response of your body to cold temperatures. If possible, stay indoors during cold weather. If you must go outdoors, wear warm clothes. It can also lower the response of your body to heat. Do not overheat. Do not over-exercise. Stay out of the sun when possible. If you must be in the sun, wear cool clothing. Drink plenty of water. If you have trouble controlling your body temperature, call your care team right away. What side effects may I notice from receiving this medication? Side effects that you should report to your care team as soon as possible: Allergic  reactions--skin rash, itching, hives, swelling of the face, lips, tongue, or throat Heart rhythm changes--fast or irregular heartbeat, dizziness, feeling faint or lightheaded, chest pain, trouble breathing High blood sugar (hyperglycemia)--increased thirst or amount of urine, unusual weakness or fatigue, blurry vision High fever, stiff muscles, increased sweating, fast or irregular heartbeat, and confusion, which may be signs of neuroleptic malignant syndrome High prolactin level--unexpected breast tissue growth, discharge from the nipple, change in sex drive or performance, irregular menstrual cycle Increase in blood pressure in children Infection--fever, chills, cough, or sore throat Low blood pressure--dizziness, feeling faint or lightheaded, blurry vision Low thyroid levels (hypothyroidism)--unusual weakness or fatigue, increased sensitivity to cold, constipation, hair loss, dry skin, weight gain, feelings of depression Pain or trouble swallowing Seizures Stroke--sudden numbness or weakness of the face, arm, or leg, trouble speaking, confusion, trouble walking, loss of balance or coordination, dizziness, severe headache, change in vision Sudden eye pain or change in vision such as blurry vision, seeing halos around lights, vision loss Thoughts of suicide or self-harm, worsening mood, feelings of depression Trouble passing urine Uncontrolled and repetitive body movements, muscle stiffness or spasms, tremors or shaking, loss of balance or coordination, restlessness, shuffling walk, which may be signs of extrapyramidal symptoms (EPS) Side effects that usually do not require medical attention (report to your care team if they continue or are bothersome): Constipation Dizziness Drowsiness Dry mouth Weight gain This list may not describe all possible side effects. Call your doctor for medical advice about side effects. You may report side effects to FDA at 1-800-FDA-1088. Where should I keep my  medication? Keep out  of the reach of children. Store at room temperature between 15 and 30 degrees C (59 and 86 degrees F). Throw away any unused medication after the expiration date. NOTE: This sheet is a summary. It may not cover all possible information. If you have questions about this medicine, talk to your doctor, pharmacist, or health care provider.  2024 Elsevier/Gold Standard (2022-01-02 00:00:00)

## 2022-11-30 NOTE — Progress Notes (Signed)
   Established Patient Office Visit  Subjective   Patient ID: Leslie Fowler, male    DOB: 1991-04-28  Age: 32 y.o. MRN: [id number]  Chief Complaint  Patient presents with   Panic Attack         HPI Mr.Hady S. Marcinek is a 32 year old male who has mental illness which includes panic attacks, auditory hallucinations and seeing the clinical social worker prior to this appointment.  Patient denies harm to self or others or visual hallucinations. Patient has No headache, No chest pain, No abdominal pain - No Nausea, No new weakness tingling or numbness, No Cough - shortness of breath.  Today he is cheerful explains how he rationale things listening to him they are very intuitive.  ROS Comprehensive ROS Pertinent positive and negative noted in HPI     Objective:   Blood Pressure 129/87   Pulse 95   Respiration 16   Weight 161 lb (73 kg)   Oxygen Saturation 100%   Body Mass Index 25.22 kg/m   Physical Exam Vitals reviewed.  Constitutional:      Appearance: He is normal weight.  HENT:     Head: Normocephalic.     Right Ear: External ear normal.     Left Ear: External ear normal.     Nose: Nose normal.  Cardiovascular:     Rate and Rhythm: Normal rate and regular rhythm.  Pulmonary:     Effort: Pulmonary effort is normal.     Breath sounds: Normal breath sounds.  Abdominal:     General: Abdomen is flat.     Palpations: Abdomen is soft.  Musculoskeletal:        General: Normal range of motion.     Cervical back: Normal range of motion.  Skin:    General: Skin is warm and dry.  Neurological:     Mental Status: He is alert and oriented to person, place, and time.  Psychiatric:        Mood and Affect: Mood normal.        Behavior: Behavior normal.        Thought Content: Thought content normal.        Judgment: Judgment normal.    No results found for any visits on 11/30/22.   The ASCVD Risk score (Arnett DK, et al., 2019) failed to calculate for the  following reasons:   The 2019 ASCVD risk score is only valid for ages 24 to 79    Assessment & Plan:  Cott was seen today for panic attack.  Diagnoses and all orders for this visit:  Mental health disorder -     Ambulatory referral to Psychiatry -     QUEtiapine (SEROQUEL) 25 MG tablet; Take 1 tablet (25 mg total) by mouth at bedtime. Return in about 7 weeks (around 01/18/2023) for medication .    [name], NP

## 2022-11-30 NOTE — BH Specialist Note (Signed)
Integrated Behavioral Health Initial In-Person Visit  MRN: [id number] Name: Leslie Fowler  Number of Integrated Behavioral Health Clinician visits: 1- Initial Visit  Session Start time: 0900    Session End time: 0936  Total time in minutes: 36   Types of Service: Individual psychotherapy  Interpretor:No. Interpretor Name and Language: n/a   Warm Hand Off Completed.    Subjective: Leslie Fowler is a 32 y.o. male accompanied by  himself Patient was referred by PCP for Depression. Patient reports the following symptoms/concerns: not feeling his best, high levels of depression, living in a tree house. Duration of problem: years; Severity of problem: moderate  Objective: Mood: Depressed and Worthless and Affect: Tearful Risk of harm to self or others: No plan to harm self or others  Life Context: Family and Social: pt is currently living with a older friend outside of their home in a tree house. Pt states that he can not stay there for long School/Work: use to work at [employer] but is currently unemployed  Self-Care: vaping he reports Life Changes: history of drug abuse and hearing voices of himself in his head.  Patient and/or Family's Strengths/Protective Factors: Seeking help  Goals Addressed: Patient will: Reduce symptoms of: agitation and stress Increase knowledge and/or ability of: coping skills  Demonstrate ability to: Increase motivation to adhere to plan of care and Decrease self-medicating behaviors  Progress towards Goals: Ongoing  Interventions: Interventions utilized: Solution-Focused Strategies and Mindfulness or Relaxation Training  Standardized Assessments completed: GAD-7 and PHQ 9  Patient and/or Family Response: pt states that he is struggling to find a job, and a place to stay, he states that he would like a medication for his depression. Pt reflected on how his life has been over the years. Building a rapport with LCSWA.  Patient Centered  Plan: Patient is on the following Treatment Plan(s):  Life Stressors  Assessment: Patient currently experiencing panic attacks, not feeling his best, high levels of depression, living in a tree house.   Patient may benefit from continued support from [facility].  Plan: Follow up with behavioral health clinician on : 4 weeks Behavioral recommendations: medication management Referral(s): Integrated Behavioral Health Services (In Clinic) and Psychiatrist for Medication Management  "From scale of 1-10, how likely are you to follow plan?": not sure  Vassie Loll, LCSWA
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
## 2022-12-04 DIAGNOSIS — F4323 Adjustment disorder with mixed anxiety and depressed mood: Secondary | ICD-10-CM | POA: Insufficient documentation

## 2022-12-28 ENCOUNTER — Ambulatory Visit (INDEPENDENT_AMBULATORY_CARE_PROVIDER_SITE_OTHER): Payer: Medicaid Other | Admitting: Licensed Clinical Social Worker

## 2023-02-01 ENCOUNTER — Encounter (INDEPENDENT_AMBULATORY_CARE_PROVIDER_SITE_OTHER): Payer: Self-pay | Admitting: Primary Care

## 2023-02-01 ENCOUNTER — Ambulatory Visit (INDEPENDENT_AMBULATORY_CARE_PROVIDER_SITE_OTHER): Payer: Medicaid Other | Admitting: Primary Care

## 2023-02-01 ENCOUNTER — Other Ambulatory Visit (INDEPENDENT_AMBULATORY_CARE_PROVIDER_SITE_OTHER): Payer: Self-pay

## 2023-02-01 VITALS — BP 121/81 | HR 76 | Resp 16 | Wt 173.4 lb

## 2023-02-01 DIAGNOSIS — F99 Mental disorder, not otherwise specified: Secondary | ICD-10-CM

## 2023-02-01 DIAGNOSIS — Z79899 Other long term (current) drug therapy: Secondary | ICD-10-CM | POA: Diagnosis not present

## 2023-02-01 DIAGNOSIS — T733XXD Exhaustion due to excessive exertion, subsequent encounter: Secondary | ICD-10-CM

## 2023-02-01 DIAGNOSIS — E559 Vitamin D deficiency, unspecified: Secondary | ICD-10-CM | POA: Diagnosis not present

## 2023-02-01 DIAGNOSIS — F4323 Adjustment disorder with mixed anxiety and depressed mood: Secondary | ICD-10-CM

## 2023-02-01 MED ORDER — QUETIAPINE FUMARATE 25 MG PO TABS: 25.0000 mg | ORAL_TABLET | Freq: Every day | ORAL | 1 refills | Status: AC

## 2023-02-02 LAB — VITAMIN B12

## 2023-02-02 LAB — VITAMIN D 25 HYDROXY (VIT D DEFICIENCY, FRACTURES)

## 2023-02-03 LAB — SPECIMEN STATUS REPORT

## 2023-02-04 NOTE — Progress Notes (Signed)
  Leslie Fowler  Leslie Fowler, is a 32 y.o. male  WUJ:[id number]  NFA:[id number]  DOB - 1990/12/26  Chief Complaint  Patient presents with   Fatigue       Subjective:   Leslie Fowler is a 32 y.o. male here today for a acute visit. Only compliant is fatigue all the time. Patient has No headache, No chest pain, No abdominal pain - No Nausea, No new weakness tingling or numbness, No Cough - shortness of breath  No problems updated.  No Known Allergies  Past Medical History:  Diagnosis Date   ADD (attention deficit disorder)    ADHD (attention deficit hyperactivity disorder)    Asthma    Disassociation disorder    Manic depression (HCC)     Current Outpatient Medications on File Prior to Visit  Medication Sig Dispense Refill   ibuprofen (ADVIL) 600 MG tablet Take 1 tablet (600 mg total) by mouth every 8 (eight) hours as needed. (Patient not taking: Reported on 11/07/2022) 21 tablet 0   oxyCODONE-acetaminophen (PERCOCET/ROXICET) 5-325 MG tablet Take 1 tablet by mouth every 6 (six) hours as needed for severe pain. (Patient not taking: Reported on 11/07/2022) 15 tablet 0   No current facility-administered medications on file prior to visit.    Objective:   Vitals:   02/01/23 1110  BP: 121/81  Pulse: 76  Resp: 16  SpO2: 99%  Weight: 173 lb 6.4 oz (78.7 kg)    Comprehensive ROS Pertinent positive and negative noted in HPI   Exam General appearance : Awake, alert, not in any distress. Speech Clear. Not toxic looking HEENT: Atraumatic and Normocephalic, pupils equally reactive to light and accomodation Neck: Supple, no JVD. No cervical lymphadenopathy.  Chest: Good air entry bilaterally, no added sounds  CVS: S1 S2 regular, no murmurs.  Abdomen: Bowel sounds present, Non tender and not distended with no gaurding, rigidity or rebound. Extremities: B/L Lower Ext shows no edema, both legs are warm to touch Neurology: Awake alert, and oriented X 3, CN  II-XII intact, Non focal Skin: No Rash  Data Review No results found for: "HGBA1C"  Assessment & Plan   [name] was seen today for fatigue.  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  Fatigue due to excessive exertion, subsequent encounter -     TSH + free T4; Future -     Vitamin B12     Patient have been counseled extensively about nutrition and exercise. Other issues discussed during this visit include: low cholesterol diet, weight control and daily exercise, foot care, annual eye examinations at Ophthalmology, importance of adherence with medications and regular follow-up. We also discussed long term complications of uncontrolled diabetes and hypertension.   Return in about 3 months (around 05/04/2023).  The patient was given clear instructions to go to ER or return to medical center if symptoms don't improve, worsen or new problems develop. The patient verbalized understanding. The patient was told to call to get lab results if they haven't heard anything in the next week.   This note has been created with Education officer, environmental. Any transcriptional errors are unintentional.   Leslie Sessions, NP 02/04/2023, 8:21 PM

## 2023-02-08 ENCOUNTER — Other Ambulatory Visit (INDEPENDENT_AMBULATORY_CARE_PROVIDER_SITE_OTHER): Payer: Self-pay | Admitting: Primary Care

## 2023-02-08 DIAGNOSIS — E559 Vitamin D deficiency, unspecified: Secondary | ICD-10-CM

## 2023-02-08 MED ORDER — ERGOCALCIFEROL 1.25 MG (50000 UT) PO CAPS: 50000.0000 [IU] | ORAL_CAPSULE | ORAL | 0 refills | Status: AC

## 2023-02-14 DIAGNOSIS — S99921A Unspecified injury of right foot, initial encounter: Secondary | ICD-10-CM | POA: Diagnosis not present

## 2023-02-14 DIAGNOSIS — X58XXXA Exposure to other specified factors, initial encounter: Secondary | ICD-10-CM | POA: Diagnosis not present

## 2023-02-14 DIAGNOSIS — S92424A Nondisplaced fracture of distal phalanx of right great toe, initial encounter for closed fracture: Secondary | ICD-10-CM | POA: Diagnosis not present

## 2023-03-19 ENCOUNTER — Other Ambulatory Visit (INDEPENDENT_AMBULATORY_CARE_PROVIDER_SITE_OTHER): Payer: Self-pay | Admitting: Primary Care

## 2023-03-19 DIAGNOSIS — Z79899 Other long term (current) drug therapy: Secondary | ICD-10-CM

## 2023-03-19 DIAGNOSIS — T733XXD Exhaustion due to excessive exertion, subsequent encounter: Secondary | ICD-10-CM

## 2023-04-05 ENCOUNTER — Other Ambulatory Visit (INDEPENDENT_AMBULATORY_CARE_PROVIDER_SITE_OTHER): Payer: Self-pay | Admitting: Primary Care

## 2023-04-05 DIAGNOSIS — E559 Vitamin D deficiency, unspecified: Secondary | ICD-10-CM

## 2023-05-04 ENCOUNTER — Telehealth (INDEPENDENT_AMBULATORY_CARE_PROVIDER_SITE_OTHER): Payer: Self-pay | Admitting: Primary Care

## 2023-05-04 NOTE — Telephone Encounter (Signed)
Called to confirm apt with pt but phone is unavailable.

## 2023-05-07 ENCOUNTER — Ambulatory Visit (INDEPENDENT_AMBULATORY_CARE_PROVIDER_SITE_OTHER): Payer: Medicaid Other | Admitting: Primary Care

## 2023-05-07 ENCOUNTER — Telehealth (INDEPENDENT_AMBULATORY_CARE_PROVIDER_SITE_OTHER): Payer: Self-pay

## 2023-05-07 NOTE — Telephone Encounter (Signed)
Contacted pt to reschedule appt was unable to reach pt due to call can't be completed at this time

## 2023-06-04 DIAGNOSIS — R55 Syncope and collapse: Secondary | ICD-10-CM | POA: Diagnosis not present

## 2023-06-04 DIAGNOSIS — R531 Weakness: Secondary | ICD-10-CM | POA: Diagnosis not present

## 2023-06-04 DIAGNOSIS — M25521 Pain in right elbow: Secondary | ICD-10-CM | POA: Diagnosis not present

## 2023-06-04 DIAGNOSIS — I10 Essential (primary) hypertension: Secondary | ICD-10-CM | POA: Diagnosis not present

## 2023-06-04 DIAGNOSIS — I959 Hypotension, unspecified: Secondary | ICD-10-CM | POA: Diagnosis not present

## 2023-06-04 DIAGNOSIS — Z043 Encounter for examination and observation following other accident: Secondary | ICD-10-CM | POA: Diagnosis not present

## 2023-06-04 DIAGNOSIS — S56911A Strain of unspecified muscles, fascia and tendons at forearm level, right arm, initial encounter: Secondary | ICD-10-CM | POA: Diagnosis not present

## 2023-06-04 DIAGNOSIS — E876 Hypokalemia: Secondary | ICD-10-CM | POA: Diagnosis not present

## 2023-06-04 DIAGNOSIS — M6282 Rhabdomyolysis: Secondary | ICD-10-CM | POA: Diagnosis not present

## 2023-06-05 DIAGNOSIS — Z043 Encounter for examination and observation following other accident: Secondary | ICD-10-CM | POA: Diagnosis not present

## 2023-06-07 DIAGNOSIS — R55 Syncope and collapse: Secondary | ICD-10-CM | POA: Diagnosis not present

## 2023-06-07 DIAGNOSIS — F331 Major depressive disorder, recurrent, moderate: Secondary | ICD-10-CM | POA: Diagnosis not present

## 2023-06-07 DIAGNOSIS — E876 Hypokalemia: Secondary | ICD-10-CM | POA: Diagnosis not present

## 2023-06-07 DIAGNOSIS — M7711 Lateral epicondylitis, right elbow: Secondary | ICD-10-CM | POA: Diagnosis not present

## 2023-06-07 DIAGNOSIS — Z09 Encounter for follow-up examination after completed treatment for conditions other than malignant neoplasm: Secondary | ICD-10-CM | POA: Diagnosis not present

## 2023-06-07 DIAGNOSIS — M6282 Rhabdomyolysis: Secondary | ICD-10-CM | POA: Diagnosis not present

## 2023-09-10 IMAGING — DX DG FOOT COMPLETE 3+V*R*
3 series · 3 of 3 positions shown · non-contrast
Comparison: None Available.

CLINICAL DATA: Right foot pain, puncture wound in the plantar
surface.

EXAM:
RIGHT FOOT COMPLETE - 3+ VIEW

[x foot ap right]
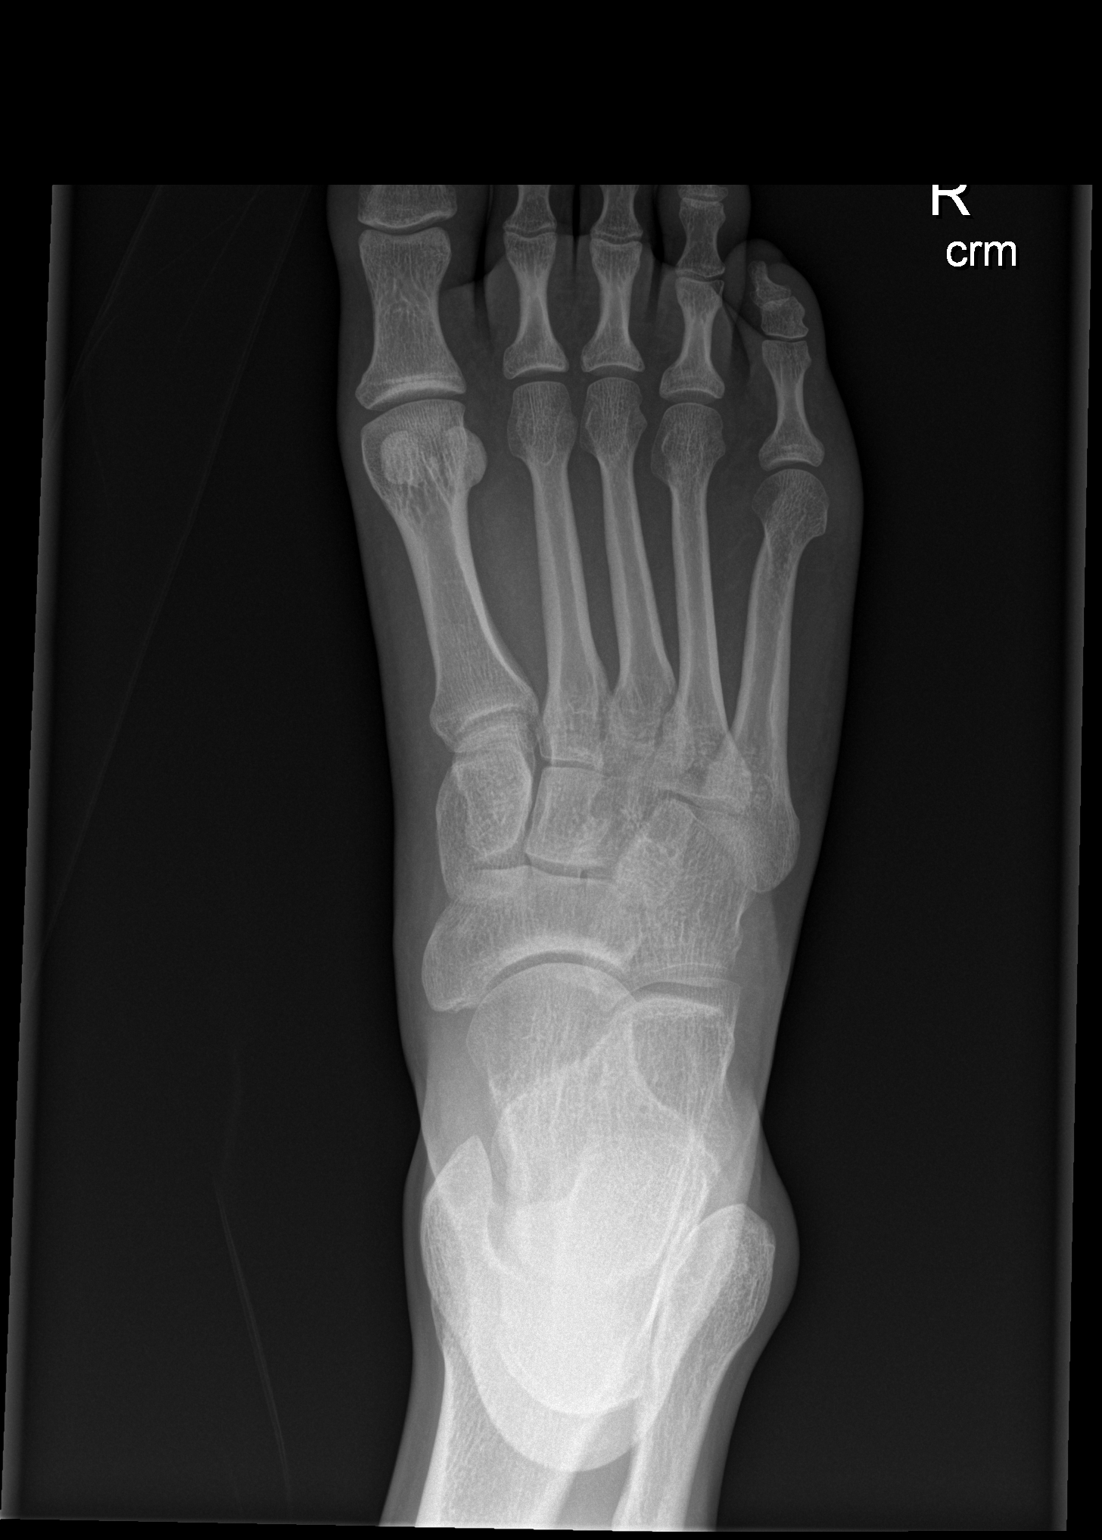

[x foot obl right]
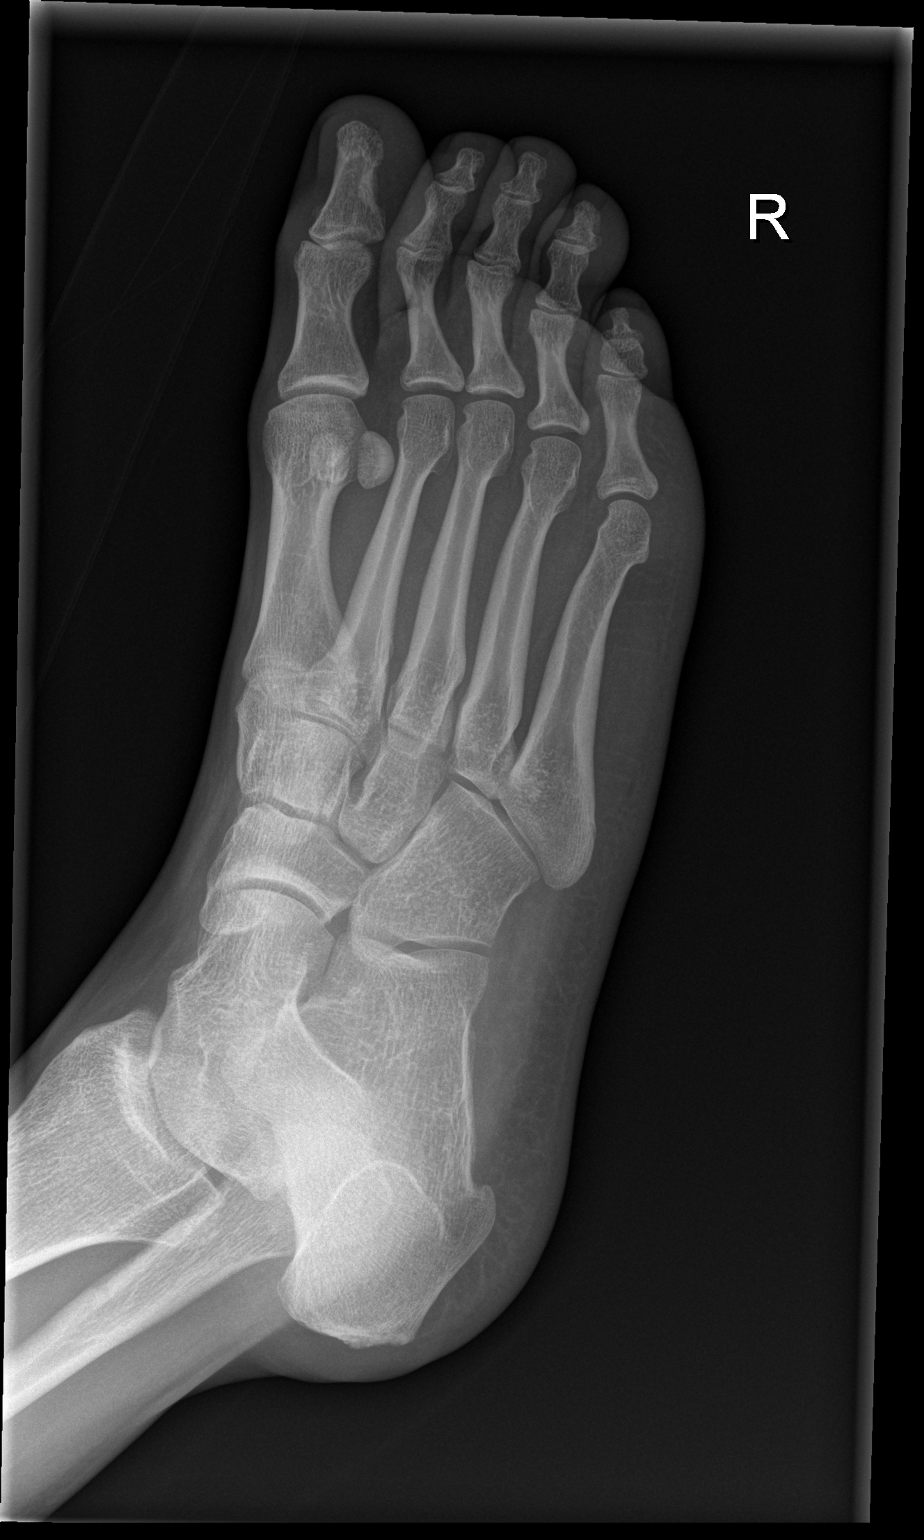

[x foot lat right]
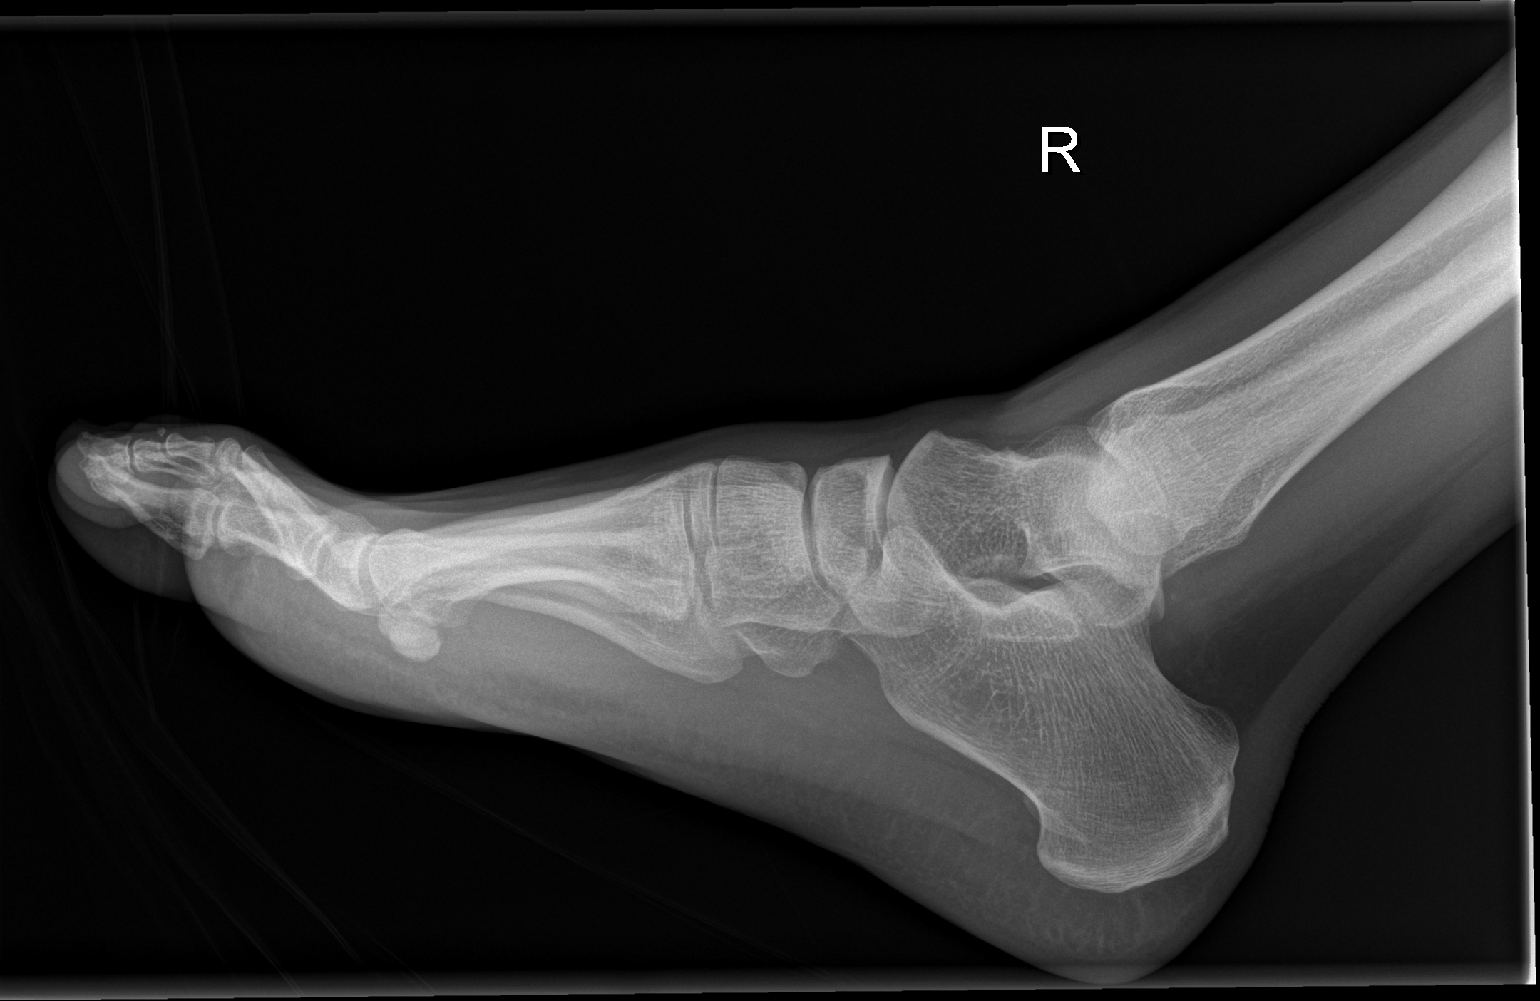

[3 of 3 positions shown; findings below may reference images not displayed]

FINDINGS: There is no evidence of fracture or dislocation. There is no
evidence of arthropathy or other focal bone abnormality. Soft
tissues are unremarkable. No evidence for foreign body.
IMPRESSION: Negative.
